# Patient Record
Sex: Female | Born: 1940
Health system: Southern US, Community
[De-identification: ages and names within clinical notes are randomized; demographics above are authoritative.]

## PROBLEM LIST (undated history)

## (undated) DIAGNOSIS — M109 Gout, unspecified: Secondary | ICD-10-CM

## (undated) DIAGNOSIS — H409 Unspecified glaucoma: Secondary | ICD-10-CM

## (undated) DIAGNOSIS — I10 Essential (primary) hypertension: Secondary | ICD-10-CM

## (undated) DIAGNOSIS — E785 Hyperlipidemia, unspecified: Secondary | ICD-10-CM

## (undated) HISTORY — PX: COLONOSCOPY: SHX174

## (undated) HISTORY — PX: ABDOMINAL HYSTERECTOMY: SHX81

## (undated) HISTORY — DX: Gout, unspecified: M10.9

## (undated) HISTORY — PX: BREAST BIOPSY: SHX20

## (undated) HISTORY — DX: Essential (primary) hypertension: I10

## (undated) HISTORY — DX: Unspecified glaucoma: H40.9

## (undated) HISTORY — PX: CATARACT EXTRACTION: SUR2

## (undated) HISTORY — DX: Hyperlipidemia, unspecified: E78.5

---

## 2000-11-27 ENCOUNTER — Inpatient Hospital Stay (HOSPITAL_COMMUNITY): Admission: AD | Admit: 2000-11-27 | Discharge: 2000-11-29 | Payer: Self-pay | Admitting: Family Medicine

## 2000-11-28 ENCOUNTER — Encounter: Payer: Self-pay | Admitting: Family Medicine

## 2000-12-12 ENCOUNTER — Encounter: Admission: RE | Admit: 2000-12-12 | Discharge: 2000-12-12 | Payer: Self-pay | Admitting: Family Medicine

## 2001-06-06 ENCOUNTER — Ambulatory Visit (HOSPITAL_COMMUNITY): Admission: RE | Admit: 2001-06-06 | Discharge: 2001-06-06 | Payer: Self-pay | Admitting: Emergency Medicine

## 2001-06-06 ENCOUNTER — Encounter: Payer: Self-pay | Admitting: Emergency Medicine

## 2003-11-17 ENCOUNTER — Ambulatory Visit: Payer: Self-pay | Admitting: Family Medicine

## 2003-11-18 ENCOUNTER — Ambulatory Visit: Payer: Self-pay | Admitting: *Deleted

## 2003-11-18 ENCOUNTER — Ambulatory Visit: Payer: Self-pay | Admitting: Family Medicine

## 2003-11-19 ENCOUNTER — Encounter (INDEPENDENT_AMBULATORY_CARE_PROVIDER_SITE_OTHER): Payer: Self-pay | Admitting: Family Medicine

## 2003-11-19 LAB — CONVERTED CEMR LAB: TSH: 1.176 microintl units/mL

## 2003-12-18 ENCOUNTER — Ambulatory Visit: Payer: Self-pay | Admitting: Family Medicine

## 2004-02-16 ENCOUNTER — Ambulatory Visit: Payer: Self-pay | Admitting: Family Medicine

## 2004-05-16 ENCOUNTER — Ambulatory Visit: Payer: Self-pay | Admitting: *Deleted

## 2004-05-16 ENCOUNTER — Ambulatory Visit: Payer: Self-pay | Admitting: Internal Medicine

## 2004-06-01 ENCOUNTER — Ambulatory Visit: Payer: Self-pay | Admitting: Family Medicine

## 2004-06-07 ENCOUNTER — Ambulatory Visit (HOSPITAL_COMMUNITY): Admission: RE | Admit: 2004-06-07 | Discharge: 2004-06-07 | Payer: Self-pay | Admitting: Family Medicine

## 2004-09-03 ENCOUNTER — Inpatient Hospital Stay (HOSPITAL_COMMUNITY): Admission: EM | Admit: 2004-09-03 | Discharge: 2004-09-04 | Payer: Self-pay | Admitting: Emergency Medicine

## 2004-09-09 ENCOUNTER — Ambulatory Visit: Payer: Self-pay | Admitting: Family Medicine

## 2004-10-04 ENCOUNTER — Ambulatory Visit: Payer: Self-pay | Admitting: Family Medicine

## 2004-10-05 ENCOUNTER — Encounter (INDEPENDENT_AMBULATORY_CARE_PROVIDER_SITE_OTHER): Payer: Self-pay | Admitting: Family Medicine

## 2005-01-24 ENCOUNTER — Ambulatory Visit: Payer: Self-pay | Admitting: Family Medicine

## 2005-01-24 ENCOUNTER — Ambulatory Visit (HOSPITAL_COMMUNITY): Admission: RE | Admit: 2005-01-24 | Discharge: 2005-01-24 | Payer: Self-pay | Admitting: Family Medicine

## 2005-04-28 ENCOUNTER — Ambulatory Visit: Payer: Self-pay | Admitting: Family Medicine

## 2005-08-25 ENCOUNTER — Ambulatory Visit: Payer: Self-pay | Admitting: Family Medicine

## 2006-07-20 ENCOUNTER — Emergency Department (HOSPITAL_COMMUNITY): Admission: EM | Admit: 2006-07-20 | Discharge: 2006-07-20 | Payer: Self-pay | Admitting: Emergency Medicine

## 2006-10-16 ENCOUNTER — Encounter (INDEPENDENT_AMBULATORY_CARE_PROVIDER_SITE_OTHER): Payer: Self-pay | Admitting: Family Medicine

## 2006-10-16 DIAGNOSIS — M109 Gout, unspecified: Secondary | ICD-10-CM | POA: Insufficient documentation

## 2006-10-16 DIAGNOSIS — E119 Type 2 diabetes mellitus without complications: Secondary | ICD-10-CM | POA: Insufficient documentation

## 2006-10-16 DIAGNOSIS — I10 Essential (primary) hypertension: Secondary | ICD-10-CM | POA: Insufficient documentation

## 2006-10-17 DIAGNOSIS — M1990 Unspecified osteoarthritis, unspecified site: Secondary | ICD-10-CM | POA: Insufficient documentation

## 2006-10-17 DIAGNOSIS — E7800 Pure hypercholesterolemia, unspecified: Secondary | ICD-10-CM | POA: Insufficient documentation

## 2007-01-09 ENCOUNTER — Emergency Department (HOSPITAL_COMMUNITY): Admission: EM | Admit: 2007-01-09 | Discharge: 2007-01-09 | Payer: Self-pay | Admitting: Family Medicine

## 2007-03-15 ENCOUNTER — Ambulatory Visit (HOSPITAL_COMMUNITY): Admission: RE | Admit: 2007-03-15 | Discharge: 2007-03-15 | Payer: Self-pay | Admitting: Family Medicine

## 2008-03-17 ENCOUNTER — Ambulatory Visit (HOSPITAL_COMMUNITY): Admission: RE | Admit: 2008-03-17 | Discharge: 2008-03-17 | Payer: Self-pay | Admitting: Family Medicine

## 2008-03-24 ENCOUNTER — Encounter: Admission: RE | Admit: 2008-03-24 | Discharge: 2008-03-24 | Payer: Self-pay | Admitting: Family Medicine

## 2008-04-14 ENCOUNTER — Emergency Department (HOSPITAL_COMMUNITY): Admission: EM | Admit: 2008-04-14 | Discharge: 2008-04-14 | Payer: Self-pay | Admitting: Emergency Medicine

## 2008-08-26 ENCOUNTER — Encounter: Admission: RE | Admit: 2008-08-26 | Discharge: 2008-08-26 | Payer: Self-pay | Admitting: Family Medicine

## 2008-11-06 ENCOUNTER — Emergency Department (HOSPITAL_COMMUNITY): Admission: EM | Admit: 2008-11-06 | Discharge: 2008-11-06 | Payer: Self-pay | Admitting: Emergency Medicine

## 2008-11-09 ENCOUNTER — Ambulatory Visit: Payer: Self-pay | Admitting: Surgery

## 2009-03-26 ENCOUNTER — Ambulatory Visit (HOSPITAL_COMMUNITY): Admission: RE | Admit: 2009-03-26 | Discharge: 2009-03-26 | Payer: Self-pay | Admitting: Family Medicine

## 2009-11-30 ENCOUNTER — Ambulatory Visit: Payer: Self-pay | Admitting: Cardiology

## 2010-03-20 ENCOUNTER — Encounter: Payer: Self-pay | Admitting: Family Medicine

## 2010-03-28 ENCOUNTER — Ambulatory Visit (HOSPITAL_COMMUNITY): Admission: RE | Admit: 2010-03-28 | Payer: Self-pay | Source: Home / Self Care | Admitting: Internal Medicine

## 2010-04-02 ENCOUNTER — Encounter: Payer: Self-pay | Admitting: Internal Medicine

## 2010-04-18 ENCOUNTER — Other Ambulatory Visit (HOSPITAL_COMMUNITY): Payer: Self-pay | Admitting: Internal Medicine

## 2010-04-18 DIAGNOSIS — Z1231 Encounter for screening mammogram for malignant neoplasm of breast: Secondary | ICD-10-CM

## 2010-05-03 ENCOUNTER — Ambulatory Visit (HOSPITAL_COMMUNITY)
Admission: RE | Admit: 2010-05-03 | Discharge: 2010-05-03 | Disposition: A | Discharge status: Home / Self Care | Payer: Medicare Other | Source: Ambulatory Visit | Attending: Internal Medicine | Admitting: Internal Medicine

## 2010-05-03 DIAGNOSIS — Z1231 Encounter for screening mammogram for malignant neoplasm of breast: Secondary | ICD-10-CM | POA: Insufficient documentation

## 2010-06-03 LAB — URINALYSIS, ROUTINE W REFLEX MICROSCOPIC
Bilirubin Urine: NEGATIVE
Nitrite: NEGATIVE
Specific Gravity, Urine: 1.011 (ref 1.005–1.030)
Urobilinogen, UA: 0.2 mg/dL (ref 0.0–1.0)
pH: 7 (ref 5.0–8.0)

## 2010-06-03 LAB — BASIC METABOLIC PANEL
BUN: 16 mg/dL (ref 6–23)
GFR calc Af Amer: >60 mL/min (ref >60)
GFR calc non Af Amer: 54 mL/min — ABNORMAL LOW (ref >60)
Potassium: 3.6 mEq/L (ref 3.5–5.1)
Sodium: 137 mEq/L (ref 135–145)

## 2010-06-03 LAB — URINE MICROSCOPIC-ADD ON

## 2010-06-03 LAB — GLUCOSE, CAPILLARY: Glucose-Capillary: 142 mg/dL — ABNORMAL HIGH (ref 70–99)

## 2010-07-12 NOTE — Procedures (Signed)
RENAL ARTERY DUPLEX EVALUATION   INDICATION:  Uncontrolled hypertension.   HISTORY:  Diabetes:  Yes.  Cardiac:  No.  Hypertension:  Yes.  Smoking:  No.   RENAL ARTERY DUPLEX FINDINGS:  Aorta-Proximal:  87 cm/s  Aorta-Mid:  84 cm/s  Aorta-Distal:  82 cm/s  Celiac Artery Origin:  143 cm/s  SMA Origin:  158 cm/s                                    RIGHT               LEFT  Renal Artery Origin:             101 cm/s            81 cm/s  Renal Artery Proximal:           130 cm/s            130 cm/s  Renal Artery Mid:                113 cm/s            110 cm/s  Renal Artery Distal:             88 cm/s             73 cm/s  Hilar Acceleration Time (AT):  Renal-Aortic Ratio (RAR):        1.49                1.49  Kidney Size:                     11.1 cm             10.7 cm  End Diastolic Ratio (EDR):  Resistive Index (RI):            0.6                 0.61   IMPRESSION:  1. Bilateral kidneys appear normal with respect to shape and size.  2. No evidence of renal artery stenosis bilaterally.   ___________________________________________  Janetta Hora Fields, MD   AS/MEDQ  D:  11/09/2008  T:  11/10/2008  Job:  [id number]

## 2010-07-15 NOTE — H&P (Signed)
[facility]  Patient:    Crystal Fowler, Crystal Fowler Visit Number: [id number] MRN: [id number]          Service Type: MED Location: [phone] Attending Physician:  Sanjuana Letters Dictated by:   Solon Palm, M.D. Admit Date:  11/27/2000   CC:         Dr. Andee Poles, [facility]  Dr. Kathrene Bongo, [facility]   History and Physical  PRIMARY CARE PHYSICIAN: Dr. Andee Poles, [facility].  RENAL PHYSICIAN: Dr. Kathrene Bongo.  CHIEF COMPLAINT: Acute renal failure.  HISTORY OF PRESENT ILLNESS: The patient is a 70 year old black female, with a history of hypertension and chronic renal insufficiency, who presented to [facility] on November 26, 2000 after having a difficult weekend. She was started on the Clonidine patch, an ARB, and Lasix for improved blood pressure control with her difficult to manage diabetes.  She had complaints over the weekend of nausea, vomiting, malaise, bilateral leg cramping, and had two presyncopal episodes on Sunday.  Symptoms improved since holding the Avapro and the patch and restarting her Clonidine she was taking t.i.d. before.  Leg cramps also resolved.  The patient was called by the primary physician at [facility] after receiving laboratories drawn on November 26, 2000 and was directed for admission for further evaluation upon discussion with Dr. Hyman Hopes, renal associate.  Upon arrival the patient still complained of malaise and fatigue; however, otherwise was asymptomatic.  PAST MEDICAL HISTORY:  1. Hypertension since 1982.  2. Partial hysterectomy in 1981 secondary to fibroid tumors.  3. History of glucose intolerance.  Last hemoglobin A1C was 6.9.  Not     currently on a diabetic diet.  4. Obesity.  MEDICATIONS:  1. Lasix 40 mg q.d.  2. Clonidine 0.3 mg p.o. t.i.d.  3. Toprol XL 20 mg p.o. q.d.  4. Avapro.  ALLERGIES: No known drug allergies.  FAMILY HISTORY: Mother  died at 19 years old of renal carcinoma.  Father died at 41 of MI and MVA.  She has four children who are healthy.  SOCIAL HISTORY: She is divorced and lives alone.  No tobacco, alcohol, or drugs.  REVIEW OF SYSTEMS: No recent weight change.  No fever or chills, sore throat, productive cough, chest pain, abdominal pain, constipation, diarrhea today though she did have some emesis and diarrhea over the weekend with leg cramps.  PHYSICAL EXAMINATION:  VITAL SIGNS: Blood pressure 146/84, heart rate 80, respirations 20, temperature 98.2 degrees.  Weight 81.71 kg.  GENERAL: The patient is comfortable, pleasant, in no acute distress.  HEENT: Normocephalic.  PERRLA.  EOMI.  Sclerae clear.  Nares patent. Oropharynx, mucosa moist.  CHEST: Clear to auscultation bilaterally.  No wheezes, rales or rhonchi.  Good effort.  CARDIOVASCULAR: Regular rate and rhythm without murmurs.  PMI nondisplaced. Pulses +2 to lower extremities.  ABDOMEN: Soft.  No hepatosplenomegaly.  Positive bowel sounds.  Nontender, nondistended.  EXTREMITIES: Without edema.  LABORATORY DATA: Sodium 138, potassium 4.0, chloride 103, CO2 27, BUN 46, creatinine 2.2.  This is an improvement from yesterdays creatinine at [facility], which was 4.4.  Albumin 2.9, calcium 9.6, phosphorus 4.6.  ASSESSMENT/PLAN: This is a 70 year old black female with hypertension and chronic renal insufficiency.  Acute renal failure.  Creatinine actually improved on primary laboratories from yesterday.  Renal service is aware of patients admission.  Was discussed with Dr. Andee Poles.  Per recommendation of Dr. Hyman Hopes the patient will have a renal ultrasound and  MRA in the a.m.  Believe patients condition likely prerenal volume depletion.  Will gently hydrate with intravenous fluid.  Hold diuretic and angiotensin II receptor blocker. Follow blood pressure.  Will consult renal service. Dictated by:   Solon Palm, M.D. Attending Physician:   Sanjuana Letters DD:  11/28/00 TD:  11/28/00 Job: [id number]

## 2010-07-15 NOTE — Discharge Summary (Signed)
[facility]  Patient:    Crystal Fowler, Crystal Fowler Visit Number: [id number] MRN: [id number]          Service Type: MED Location: [phone] Attending Physician:  Sanjuana Letters Dictated by:   Vear Clock, M.D. Admit Date:  11/27/2000 Discharge Date: 11/29/2000   CC:         Dr. _______ [facility], primary physician   Discharge Summary  CHIEF COMPLAINT:  Acute renal failure.  PRIMARY PHYSICIAN:  Dr. __________, [facility].  DISCHARGE DIAGNOSES: 1. Hypertension. 2. Acute renal failure likely secondary to prerenal causes. 3. Hyperglycemia.  DISCHARGE MEDICATIONS; 1. Toprol XL 100 mg p.o. q.d. 2. Hydrochlorothiazide 25 mg p.o. q.d. 3. Clonidine 0.3 mg p.o. t.i.d.  FOLLOW-UP:  The patient is to follow up at [facility] next Wednesday, December 05, 2000, at 8 a.m. with Dr. ________.  Dr. ________ is to evaluate the patients renal function, hyperglycemia and hypertension at that time and is to ascertain whether the patient needs to see Garnetta Buddy, M.D., for follow-up at that time.  PROCEDURES AND DIAGNOSTIC STUDIES:  Renal MRA which was essentially negative but with one filling defect which could have been an artifact.  Renal ultrasound within normal limits.  CONSULTANT:  Cecille Aver, M.D., who the patient declined to see.  ADMISSION H&P:  This is a 70 year old African-American female with a history of hypertension and chronic renal insufficiency who [facility], [facility], after having a difficult weekend. She started on the clonidine patch and ARB and Lasix for blood pressure control with her difficult to manage hypertension.  She was started on those medicines by Dr. Kathrene Bongo.  She had complaints over the weekend of nausea and vomiting and malaise, bilateral leg cramping and two episodes of presyncope on Sunday.  The patient attempted to contact Dr.  Kathrene Bongo and did not receive a response.  Symptoms improved since holding the Avapro and the patch and restarting her clonidine she was taking t.i.d. prior to this episode.  Leg cramps also resolved.  The patient was called [facility] after receiving laboratories drawn on November 26, 2000, and was directed for admission for further evaluation upon discussion with Dr. Hyman Hopes, renal associate.  LABS ON ADMISSION:  Sodium 138, potassium 4.0, chloride 103, CO2 27, BUN 46, creatinine 2.2 which is an improvement from the day prior to admissions creatinine at [facility] which was 4.4.  HOSPITAL COURSE:  #1 - RENAL:  The patient was worked up for essential hypertension versus RTA with CBC, BMET, TSH, chest x-ray, and EKG.  All of these tests were essentially normal except for hyperglycemia, BUN and creatinine.  Elevation of BUN and creatinine resolved during the patients hospitalization.  Renal ultrasound and MRA were essentially normal.  Per radiology, the filling defect on the MRA is likely an artifact based on patient respiration at the end of the study causing elevation of the kidney; however, if the patient continues with refractory hypertension that does not respond to medications, the patient may need eventual renal balloon angioplasty secondary to fibromuscular dysplasia.  As the patients kidney function tests had normalized prior to discharge and the patient was feeling better, the patient was discharged to home with follow-up in one week.  #2 - HYPOKALEMIA:  On November 28, 2000, potassium was found to be 3.1.  It was likely secondary to nausea and vomiting on admission which  resolved during the patients hospital course.  Repeat BMET on November 29, 2000, showed resolution of the hyperkalemia but the patient was still given two doses of K-Dur prior to discharge.  #3 -  HYPERTENSION:  The patient was stable on Toprol XL and  clonidine. Hydrochlorothiazide was added to the patients regimen for outpatient.  Dr. ________ to evaluate the patient as an outpatient to determine whether a taper of the clonidine would be appropriate for this patient.  Again, if patient continues to have refractory hypertension, a further renal work-up may be indicated.  #4 - HYPERGLYCEMIA:  The patients blood sugars remained elevated throughout her hospitalization.  This should be followed up on an outpatient basis with Dr. _______.  DISCHARGE LABS:  CBC on November 28, 2000, wbc 6.3, hemoglobin 13.2, hematocrit 39.1, platelets 261 with an MCV of 85.5.  BMET on November 29, 2000, shows sodium 140, potassium 3.5, chloride 105, CO2 30, BUN 18, creatinine 1.2, and glucose 137.  Other glucose values during admission were 166 and 111 on admission.  UA during admission was negative except for glycosuria at 250. A 24-hour urine is pending for creatinine, protein, electrolytes, as well as VMA and metanephrines.  Urine culture was negative. TSH 1.615.  Chest x-ray shows mild cardiac enlargement with no active disease.  The patient was stabilized and discharged to home without further incident.  Of note, additionally, the patient did have an apparent panic attack after her MRA for approximately two hours after the procedure, the patient was lightheaded with palpitations and shortness of breath and overall discomfort. This resolved with time and the patient was stable prior to discharge.Dictated y:   Vear Clock, M.D. Attending Physician:  Sanjuana Letters DD:  11/29/00 TD:  11/30/00 Job: [id number]

## 2010-07-15 NOTE — H&P (Signed)
Crystal Fowler, Crystal Fowler                  ACCOUNT NO.:  [id number]   MEDICAL RECORD NO.:  [id number]          PATIENT TYPE:  INP   LOCATION:  1823                         FACILITY:  [facility]   PHYSICIAN:  Renato Battles, M.D.     DATE OF BIRTH:  [birth year]   DATE OF ADMISSION:  09/03/2004  DATE OF DISCHARGE:                                HISTORY & PHYSICAL   REASON FOR ADMISSION:  Hypertensive emergency, headache, and chest pain.   PRIMARY CARE PHYSICIAN:  Dr. Emelda Fear from [facility].   HISTORY OF PRESENT ILLNESS:  The patient is a pleasant 70 year old African-  American female who has experienced severe continuous throbbing occipital  headache for the last 24 hours with one episode of chest pain earlier today,  left sided, radiating to the left shoulder, so she presented to the  emergency room where initial check of blood pressure revealed blood pressure  of 229/148. The patient received some clonidine and labetalol, and this  brought the blood pressure down to better numbers, and her chest pain  resolved; however, she continued to have shoulder pain and headache. At this  time, the patient denies any shortness of breath. Denies any chest pain. She  reports generalized weakness but no focal deficits.   REVIEW OF SYSTEMS:  CONSTITUTIONAL:  No fever, chills, or night sweats. No  weight changes. GASTROINTESTINAL:  Positive for nausea and vomiting x2  earlier today. No diarrhea or constipation. CARDIOPULMONARY:  Positive for  chest pain as described above. No shortness of breath. No orthopnea or PND.  No cough. GENITOURINARY:  No dysuria, hematuria, or retention.   PAST MEDICAL HISTORY:  1.  Hypertension.  2.  Type 2 diabetes.  3.  Hyperlipidemia.  4.  Partial hysterectomy secondary to fibroids.   FAMILY HISTORY:  Positive for hypertension and CAD.   SOCIAL HISTORY:  She lives alone in [place]. She is a part time school  custodian. No tobacco or alcohol or drugs.   ALLERGIES:  No  known drug allergies.   HOME MEDICATIONS:  1.  Monopril 20 mg p.o. q.d.  2.  Hydrochlorothiazide 25 mg p.o. q.d.  3.  Zocor 10 mg p.o. q.d.  4.  Amaryl 4 mg p.o. b.i.d.  5.  Clonidine 0.3 mg p.o. t.i.d.   PHYSICAL EXAMINATION:  GENERAL:  The patient is alert and oriented x3 in  mild distress.  VITAL SIGNS:  Temperature 97.4, heart rate 75, respiratory rate 22, blood  pressure currently 150/112.  HEENT:  The head is atraumatic and normocephalic. Pupils are equal, round,  and reactive to light and accommodation. Extraocular movements intact  bilaterally.  NECK:  No lymphadenopathy. No thyromegaly. No JVD.  CHEST:  Clear to auscultation bilaterally. No wheezing, rales, or rhonchi.  HEART:  Regular rate and rhythm. No murmurs.  ABDOMEN:  Soft, nontender, nondistended. No active bowel sounds.  EXTREMITIES:  No cyanosis, edema, or clubbing.   STUDIES:  CBC showed elevated hemoglobin of 15.6, normal white count and  platelets.   Electrolytes showed potassium of 3.1, otherwise normal. Normal renal  function. Glucose 151.  Liver functions showed elevated AST at 81 with normal ALT, normal alkaline  phosphatase, normal protein and albumin.   Head CT showed no acute changes, no bleed. Some subacute lacunar infarcts in  the basal ganglia.   Chest x-ray was negative.   ASSESSMENT:  1.  Hypertensive emergency.  2.  Chest pain.  3.  Type 2 diabetes.  4.  Elevated AST.  5.  Hypokalemia.  6.  Dehydration.  7.  Hyperlipidemia.   PLAN OF ACTION:  1.  Admit to intensive care unit.  2.  Start home medicines.  3.  Nitroglycerin drip to keep blood pressure below 150/95.  4.  K-Dur to replace potassium.  5.  IV fluids for dehydration.  6.  Recheck liver functions.  7.  Cardiac enzymes q.8h. x3 and EKG.  8.  DVT prophylaxis.       SA/MEDQ  D:  09/03/2004  T:  09/03/2004  Job:  [id number]   cc:   Emelda Fear, M.D.  [facility]

## 2010-07-15 NOTE — Discharge Summary (Signed)
Crystal Fowler, Crystal Fowler                  ACCOUNT NO.:  [id number]   MEDICAL RECORD NO.:  [id number]          PATIENT TYPE:  INP   LOCATION:  3705                         FACILITY:  [facility]   PHYSICIAN:  Elliot Cousin, M.D.    DATE OF BIRTH:  [birth year]   DATE OF ADMISSION:  09/03/2004  DATE OF DISCHARGE:  09/04/2004                                 DISCHARGE SUMMARY   DISCHARGE DIAGNOSES:  1.  Hypertensive emergency.  2.  Chest pain, myocardial infarction ruled out with negative cardiac      enzymes.  3.  Acute renal insufficiency, resolved during the hospital course.  4.  Chronic versus subacute small left basil ganglia stroke per CT scan of      the head without contrast.  5.  Type 2 diabetes mellitus.  6.  Hyperlipidemia  7.  Headache thought to be secondary to severe hypertension.  8.  Transient hypokalemia, repleted during the hospital course.Marland Kitchen   DISCHARGE MEDICATIONS:  1.  Clonidine 0.2 mg 1/2 tablet at breakfast, 1 pill at lunch, and 1 pill at      bedtime. Continue this regimen for 2 days and then called Dr. Audria Nine      for further management.  2.  Monopril 20 mg 1/2 tablet at breakfast for 2 days, then back up to 1      pill at breakfast daily  3.  Hydrochlorothiazide 25 mg 1/2 tablet daily for 2 days and then back up      to 1 pill daily thereafter.  4.  Amaryl 4 mg 1/2 tablet twice daily. (Do not take if blood sugar is less      than 100.)  5.  Aspirin 325 mg daily.  6.  Zocor 10 mg at bedtime.  7.  Tylenol 650 mg every 6 hours as needed for pain.   PROCEDURE PERFORMED:  CT scan of the head without contrast on July8,2006.  The results revealed lacunar infarct left basal ganglia, age indeterminate,  most likely subacute or chronic. No evidence of hemorrhage, hydrocephalus,  tumor, vascular lesion, or extra-axial fluid collection.   HISTORY OF PRESENT ILLNESS:  The patient is a 70 year old African-American  lady with a past medical history significant for hypertension,  type 2  diabetes mellitus, and hyperlipidemia, who presented to the emergency  department on July8,2006, with a chief complaint of left-sided chest pain,  generalized weakness, and occipital headache. When the patient was evaluated  in the emergency department, her blood pressure was 229/148. A CT scan of  the head revealed a small lacunar infarct in the left basil ganglia, most  likely subacute or chronic. The EKG revealed normal sinus rhythm with  biatrial enlargement. The patient was, therefore, admitted for further  evaluation and management.   HOSPITAL COURSE:  1.  HYPERTENSIVE EMERGENCY: The initial treatment started in the emergency      department, when the patient was given clonidine and IV labetalol x 1.      The patient was then started on a nitroglycerin drip. The intent was to      transfer  the patient to the ICU, however, the patient had to reside in      the emergency department most of the day. While in the emergency      department, the patient's blood pressure fell to 116 systolically. The      nitroglycerin drip was, therefore, discontinued. Prior to the      discontinuation of the nitroglycerin drip, the patient was restarted on      all of her home medications which included clonidine 3 mg in the      morning, Monopril 20 mg in the morning, and HCTZ 25 mg in the morning.      The patient did complain of some dizziness and lightheadedness with a      fall in the blood pressure. She was subsequently transferred to a      telemetry bed. Her antihypertensive medications were modified. The      Monopril and HCTZ were held for 24 hours following the initial dose that      was given on the morning of hospital admission. The clonidine was      decreased to only 0.1 mg the following day. . Over the past 24 hours,      the patient's blood pressure has ranged from 106-140 systolically and 68-      89 diastolically. The patient still complains of a mild headache;      however,  it is much improved. The patient no longer complains of chest      pain.    Cardiac enzymes were ordered to rule out a myocardial infarction. The  cardiac enzymes x3 were completely negative. The TSH was assessed and was  within normal limits at 2.207. The CT scan of the head, as stated above,  revealed a small left lacunar basil ganglia infarct which appeared to be  subacute or chronic.  It is doubtful that the patient's headache was  secondary to the infarct. The patient was informed of the CT scan findings.  She was certainly advised to be compliant with antihypertensive medication  regimens as prescribed by Dr. Audria Nine. The patient was also advised to  follow a low-salt diabetic diet. The patient's blood pressures during  hospital course have been reasonable and somewhat well controlled, although  it has been just a little over 24 hours. Given the recent blood pressures,  the patient was given specific instructions on modifying the regimen. She  was advised to take clonidine 0.2 mg 1/2 tablet at breakfast, 1 tablet at  lunch, and 1 tablet at bedtime for 2 days and then call Dr. Audria Nine for  further followup.  As indicated above, the patient was prescribed Monopril  and HCTZ at half the doses x2 days and then back up to the usual dose as  previously prescribed. The patient stated that she understood. The patient  demonstrated no focal neurological findings on exam during hospitalization.  She ambulated with her daughter without difficulty prior to hospital  discharge. The patient was advised to take Tylenol as needed for headache.   #2.  ACUTE RENAL INSUFFICIENCY: During the hospital course, the patient's  BUN increased from 14 to 31, and the creatinine increased from 1.3 to 1.9.  She was started on gentle IV fluids for 24 hours. Following gentle volume  repletion therapy, her BUN improved to 23, and her creatinine improved to 1.3. The acute renal insufficiency may have been  secondary to volume  depletion versus hypoperfusion of the kidneys after her blood pressures fell  dramatically during hospital course.   #3.  TYPE 2 DIABETES MELLITUS:  The the patient was restarted on Amaryl 4 mg  twice daily. A sliding scale insulin regimen was prescribed as well. Her  capillary blood sugars were assessed q.a.c. and q.h.s.. The patient did have  an episode of a low blood sugar ranging into the 40s. The Amaryl was,  therefore, discontinued, and the patient was just treated with a sliding  scale insulin regimen. At the time of hospital discharge, her capillary  blood sugar was 91. The patient was, therefore, advised to take 4 mg 1/2  tablet b.i.d. until her capillary blood sugars became greater than 160. When  her capillary blood sugars reach 160 or greater, she was advised to return  to 4 mg of Amaryl b.i.d. The patient was also advised to not take Amaryl for  capillary blood sugars below 100. Her hemoglobin A1c was assessed during the  hospital course and was found to be 7.9. There may be an element of  noncompliance.   DISCHARGE LABORATORY DATA:  Sodium 137. Potassium 4.0, chloride 102, CO2 30,  glucose 122, BUN 23, creatinine 1.3, calcium 9.0   DISCHARGE DISPOSITION:  The patient will be discharged home on today. She is  improved and in stable condition. She was advised to follow up with Dr.  Audria Nine in 3-4 days. The patient was advised to check her blood pressures  daily over the next few days. She was advised to call Dr. Audria Nine if her  systolic blood pressures  were greater than 165. The patient was advised to take medications as  prescribed. She was also advised to follow a low-salt diabetic diet. As  pointed out above, there may be an element of noncompliance given that the  patient's capillary blood sugars and blood pressures were well-controlled  during hospital course.       DF/MEDQ  D:  09/04/2004  T:  09/04/2004  Job:  [id number]

## 2010-09-14 ENCOUNTER — Ambulatory Visit: Payer: Medicare Other | Admitting: Cardiology

## 2011-04-04 ENCOUNTER — Other Ambulatory Visit (HOSPITAL_COMMUNITY)
Admission: RE | Admit: 2011-04-04 | Disposition: A | Discharge status: Home / Self Care | Payer: Medicare Other | Source: Ambulatory Visit | Attending: Internal Medicine | Admitting: Internal Medicine

## 2011-04-04 ENCOUNTER — Other Ambulatory Visit (HOSPITAL_COMMUNITY)
Admission: RE | Admit: 2011-04-04 | Discharge: 2011-04-04 | Disposition: A | Discharge status: Home / Self Care | Payer: Medicare Other | Attending: Internal Medicine | Admitting: Internal Medicine

## 2011-04-04 ENCOUNTER — Other Ambulatory Visit: Payer: Self-pay | Admitting: Registered Nurse

## 2011-04-04 DIAGNOSIS — Z124 Encounter for screening for malignant neoplasm of cervix: Secondary | ICD-10-CM | POA: Insufficient documentation

## 2011-04-20 ENCOUNTER — Other Ambulatory Visit (HOSPITAL_COMMUNITY): Payer: Self-pay | Admitting: Internal Medicine

## 2011-04-20 DIAGNOSIS — Z1231 Encounter for screening mammogram for malignant neoplasm of breast: Secondary | ICD-10-CM

## 2011-05-15 ENCOUNTER — Ambulatory Visit (HOSPITAL_COMMUNITY)
Admission: RE | Admit: 2011-05-15 | Discharge: 2011-05-15 | Disposition: A | Discharge status: Home / Self Care | Payer: Medicare Other | Source: Ambulatory Visit | Attending: Internal Medicine | Admitting: Internal Medicine

## 2011-05-15 DIAGNOSIS — Z1231 Encounter for screening mammogram for malignant neoplasm of breast: Secondary | ICD-10-CM | POA: Insufficient documentation

## 2011-07-07 ENCOUNTER — Encounter: Payer: Self-pay | Admitting: *Deleted

## 2011-11-09 ENCOUNTER — Encounter: Payer: Self-pay | Admitting: *Deleted

## 2012-04-18 ENCOUNTER — Other Ambulatory Visit (HOSPITAL_COMMUNITY): Payer: Self-pay | Admitting: Internal Medicine

## 2012-04-18 DIAGNOSIS — Z1231 Encounter for screening mammogram for malignant neoplasm of breast: Secondary | ICD-10-CM

## 2012-05-16 ENCOUNTER — Ambulatory Visit (HOSPITAL_COMMUNITY)
Admission: RE | Admit: 2012-05-16 | Discharge: 2012-05-16 | Disposition: A | Discharge status: Home / Self Care | Payer: Medicare Other | Source: Ambulatory Visit | Attending: Internal Medicine | Admitting: Internal Medicine

## 2012-05-16 DIAGNOSIS — Z1231 Encounter for screening mammogram for malignant neoplasm of breast: Secondary | ICD-10-CM | POA: Insufficient documentation

## 2012-05-21 ENCOUNTER — Other Ambulatory Visit: Payer: Self-pay | Admitting: Internal Medicine

## 2012-05-21 DIAGNOSIS — R928 Other abnormal and inconclusive findings on diagnostic imaging of breast: Secondary | ICD-10-CM

## 2012-05-30 ENCOUNTER — Ambulatory Visit
Admission: RE | Admit: 2012-05-30 | Discharge: 2012-05-30 | Disposition: A | Discharge status: Home / Self Care | Payer: Medicare Other | Source: Ambulatory Visit | Attending: Internal Medicine | Admitting: Internal Medicine

## 2012-05-30 ENCOUNTER — Other Ambulatory Visit: Payer: Self-pay | Admitting: Internal Medicine

## 2012-05-30 DIAGNOSIS — R928 Other abnormal and inconclusive findings on diagnostic imaging of breast: Secondary | ICD-10-CM

## 2012-06-04 ENCOUNTER — Ambulatory Visit
Admission: RE | Admit: 2012-06-04 | Discharge: 2012-06-04 | Disposition: A | Discharge status: Home / Self Care | Payer: Medicare Other | Source: Ambulatory Visit | Attending: Internal Medicine | Admitting: Internal Medicine

## 2012-06-04 DIAGNOSIS — R928 Other abnormal and inconclusive findings on diagnostic imaging of breast: Secondary | ICD-10-CM

## 2012-11-17 ENCOUNTER — Emergency Department (HOSPITAL_COMMUNITY)
Admission: EM | Admit: 2012-11-17 | Discharge: 2012-11-18 | Disposition: A | Discharge status: Home / Self Care | Payer: Medicare Other | Attending: Emergency Medicine | Admitting: Emergency Medicine

## 2012-11-17 ENCOUNTER — Emergency Department (HOSPITAL_COMMUNITY): Payer: Medicare Other

## 2012-11-17 ENCOUNTER — Encounter (HOSPITAL_COMMUNITY): Payer: Self-pay | Admitting: Emergency Medicine

## 2012-11-17 DIAGNOSIS — E785 Hyperlipidemia, unspecified: Secondary | ICD-10-CM | POA: Insufficient documentation

## 2012-11-17 DIAGNOSIS — I10 Essential (primary) hypertension: Secondary | ICD-10-CM | POA: Insufficient documentation

## 2012-11-17 DIAGNOSIS — E119 Type 2 diabetes mellitus without complications: Secondary | ICD-10-CM | POA: Insufficient documentation

## 2012-11-17 DIAGNOSIS — N390 Urinary tract infection, site not specified: Secondary | ICD-10-CM | POA: Insufficient documentation

## 2012-11-17 DIAGNOSIS — N200 Calculus of kidney: Secondary | ICD-10-CM | POA: Insufficient documentation

## 2012-11-17 DIAGNOSIS — Z79899 Other long term (current) drug therapy: Secondary | ICD-10-CM | POA: Insufficient documentation

## 2012-11-17 LAB — CBC WITH DIFFERENTIAL/PLATELET
Basophils Absolute: 0 10*3/uL (ref 0.0–0.1)
Basophils Relative: 0 % (ref 0–1)
Eosinophils Absolute: 0.1 10*3/uL (ref 0.0–0.7)
Eosinophils Relative: 1 % (ref 0–5)
HCT: 37.8 % (ref 36.0–46.0)
MCH: 29.6 pg (ref 26.0–34.0)
MCHC: 33.9 g/dL (ref 30.0–36.0)
MCV: 87.3 fL (ref 78.0–100.0)
Monocytes Absolute: 0.5 10*3/uL (ref 0.1–1.0)
Platelets: 249 10*3/uL (ref 150–400)
RDW: 14.5 % (ref 11.5–15.5)
WBC: 6.6 10*3/uL (ref 4.0–10.5)

## 2012-11-17 LAB — URINALYSIS, ROUTINE W REFLEX MICROSCOPIC
Bilirubin Urine: NEGATIVE
Ketones, ur: NEGATIVE mg/dL
Nitrite: NEGATIVE
Protein, ur: NEGATIVE mg/dL

## 2012-11-17 LAB — BASIC METABOLIC PANEL
BUN: 17 mg/dL (ref 6–23)
Calcium: 10.3 mg/dL (ref 8.4–10.5)
Creatinine, Ser: 1.06 mg/dL (ref 0.50–1.10)
GFR calc Af Amer: 60 mL/min — ABNORMAL LOW (ref >90)

## 2012-11-17 LAB — URINE MICROSCOPIC-ADD ON

## 2012-11-17 MED ORDER — SODIUM CHLORIDE 0.9 % IV BOLUS (SEPSIS)
1000.0000 mL | Freq: Once | INTRAVENOUS | Status: AC
  Administered 2012-11-17: 1000 mL via INTRAVENOUS

## 2012-11-17 MED ORDER — CIPROFLOXACIN HCL 500 MG PO TABS: 500.0000 mg | ORAL_TABLET | Freq: Two times a day (BID) | ORAL | Status: DC

## 2012-11-17 MED ORDER — CIPROFLOXACIN HCL 500 MG PO TABS
500.0000 mg | ORAL_TABLET | Freq: Once | ORAL | Status: AC
  Administered 2012-11-17: 500 mg via ORAL
  Filled 2012-11-17: qty 1

## 2012-11-17 MED ORDER — ONDANSETRON HCL 4 MG/2ML IJ SOLN
4.0000 mg | Freq: Once | INTRAMUSCULAR | Status: AC
  Administered 2012-11-17: 4 mg via INTRAVENOUS
  Filled 2012-11-17: qty 2

## 2012-11-17 MED ORDER — IOHEXOL 300 MG/ML  SOLN
100.0000 mL | Freq: Once | INTRAMUSCULAR | Status: AC | PRN
  Administered 2012-11-17: 100 mL via INTRAVENOUS

## 2012-11-17 MED ORDER — FENTANYL CITRATE 0.05 MG/ML IJ SOLN
50.0000 ug | Freq: Once | INTRAMUSCULAR | Status: AC
  Administered 2012-11-17: 50 ug via INTRAVENOUS
  Filled 2012-11-17: qty 2

## 2012-11-17 MED ORDER — HYDROCODONE-ACETAMINOPHEN 5-325 MG PO TABS: 1.0000 | ORAL_TABLET | Freq: Four times a day (QID) | ORAL | Status: DC | PRN

## 2012-11-17 MED ORDER — IOHEXOL 300 MG/ML  SOLN
50.0000 mL | Freq: Once | INTRAMUSCULAR | Status: AC | PRN
  Administered 2012-11-17: 50 mL via ORAL

## 2012-11-17 MED ORDER — MORPHINE SULFATE 4 MG/ML IJ SOLN
4.0000 mg | Freq: Once | INTRAMUSCULAR | Status: AC
  Administered 2012-11-17: 4 mg via INTRAVENOUS
  Filled 2012-11-17: qty 1

## 2012-11-17 NOTE — ED Notes (Signed)
Patient transported to CT 

## 2012-11-17 NOTE — ED Notes (Signed)
Patient ambulated to bathroom.

## 2012-11-17 NOTE — ED Provider Notes (Signed)
CSN: [id number]     Arrival date & time 11/17/12  2032 History   First MD Initiated Contact with Patient 11/17/12 2048     Chief Complaint  Patient presents with  . Abdominal Pain   (Consider location/radiation/quality/duration/timing/severity/associated sxs/prior Treatment) Patient is a 72 y.o. female presenting with abdominal pain.  Abdominal Pain  Pt reports 2 days of gradually worsening L sided hip/flank pain. Worse with some movement, radiating into L leg. Not associated with fever, vomiting, diarrhea, constipation or dysuria. No prior history of same.   Past Medical History  Diagnosis Date  . Hypertension     Refractory / Severe  . Chest pain   . Dyspnea   . Diabetes mellitus     type 2  . Dyslipidemia    History reviewed. No pertinent past surgical history. Family History  Problem Relation Age of Onset  . Heart attack Father 28  . Cancer Mother 38    Urinary Tract   History  Substance Use Topics  . Smoking status: Never Smoker   . Smokeless tobacco: Not on file  . Alcohol Use: No   OB History   Grav Para Term Preterm Abortions TAB SAB Ect Mult Living                 Review of Systems  Gastrointestinal: Positive for abdominal pain.   All other systems reviewed and are negative except as noted in HPI.   Allergies  Review of patient's allergies indicates no known allergies.  Home Medications   Current Outpatient Rx  Name  Route  Sig  Dispense  Refill  . amLODipine-olmesartan (AZOR) 10-40 MG per tablet   Oral   Take 1 tablet by mouth daily.         . carvedilol (COREG) 25 MG tablet   Oral   Take 25 mg by mouth 2 (two) times daily with a meal.         . cloNIDine (CATAPRES) 0.3 MG tablet   Oral   Take 0.3 mg by mouth 3 (three) times daily.         . metFORMIN (GLUCOPHAGE) 1000 MG tablet   Oral   Take 1,000 mg by mouth 2 (two) times daily with a meal.         . Multiple Vitamin (MULTIVITAMIN) tablet   Oral   Take 1 tablet by mouth  daily.         . pioglitazone (ACTOS) 45 MG tablet   Oral   Take 45 mg by mouth daily.         . rosuvastatin (CRESTOR) 10 MG tablet   Oral   Take 10 mg by mouth once a week.           BP 165/86  Pulse 70  Temp(Src) 98.7 F (37.1 C) (Oral)  Resp 18  SpO2 97% Physical Exam  Nursing note and vitals reviewed. Constitutional: She is oriented to person, place, and time. She appears well-developed and well-nourished.  HENT:  Head: Normocephalic and atraumatic.  Eyes: EOM are normal. Pupils are equal, round, and reactive to light.  Neck: Normal range of motion. Neck supple.  Cardiovascular: Normal rate, normal heart sounds and intact distal pulses.   Pulmonary/Chest: Effort normal and breath sounds normal.  Abdominal: Bowel sounds are normal. She exhibits no distension. There is tenderness (LLQ). There is no rebound and no guarding.  Musculoskeletal: Normal range of motion. She exhibits tenderness (L lateral hip). She exhibits no edema.  Neurological: She  is alert and oriented to person, place, and time. She has normal strength. No cranial nerve deficit or sensory deficit.  Skin: Skin is warm and dry. No rash noted.  Psychiatric: She has a normal mood and affect.    ED Course  Procedures (including critical care time) Labs Review Labs Reviewed  URINALYSIS, ROUTINE W REFLEX MICROSCOPIC - Abnormal; Notable for the following:    APPearance CLOUDY (*)    Hgb urine dipstick SMALL (*)    Leukocytes, UA LARGE (*)    All other components within normal limits  BASIC METABOLIC PANEL - Abnormal; Notable for the following:    Glucose, Bld 134 (*)    GFR calc non Af Amer 52 (*)    GFR calc Af Amer 60 (*)    All other components within normal limits  CBC WITH DIFFERENTIAL  URINE MICROSCOPIC-ADD ON   Imaging Review Ct Abdomen Pelvis W Contrast  11/17/2012   CLINICAL DATA:  Left lower quadrant abdominal pain with abdominal distention, nausea and vomiting.  EXAM: CT ABDOMEN AND  PELVIS WITH CONTRAST  TECHNIQUE: Multidetector CT imaging of the abdomen and pelvis was performed using the standard protocol following bolus administration of intravenous contrast.  CONTRAST:  50mL OMNIPAQUE IOHEXOL 300 MG/ML SOLN, OMNIPAQUE IOHEXOL 300 MG/ML SOLN ; The patient was given the usual instructions regarding cessation of metformin therapy related to intravenous contrast administration and is to follow up with the ordering physician.  COMPARISON:  Abdominal pelvic CT 08/26/2008.  FINDINGS: There is mild scarring at the lung bases. No significant pleural or pericardial effusion is present.  There are nonobstructing calculi in the lower pole and interpolar region of the left kidney. The left kidney demonstrates mild hydronephrosis, perinephric soft tissue stranding and delayed contrast excretion. There is an obstructing 2 mm calculus at the left ureterovesical junction (axial image number 71). The right kidney and right ureter appear normal. No bladder abnormalities are seen.  The liver, gallbladder, biliary system, pancreas, spleen and adrenal glands appear normal.  No other inflammatory changes are identified. There is minimal atherosclerosis for age. There is no pelvic mass. Both ovaries appear normal.  There are mild degenerative changes in the spine. The common hamstring tendons are degenerated with calcifications bilaterally.  IMPRESSION: 1. Obstructing 2 mm calculus at the left ureterovesical junction as described. 2. No other acute findings. No inflammatory changes demonstrated. 3. Nonobstructing left renal calculi.   Electronically Signed   By: Roxy Horseman   On: 11/17/2012 23:34    MDM   1. Kidney stone   2. UTI (urinary tract infection)     Pt points mainly to L hip and lower back, but has tenderness over the LLQ as well. ?diverticulitis or kidney stone vs arthritis/sciatica. Will check labs, UA, and CT. Pain and nausea meds.   11:44 PM Labs and imaging reviewed. CT shows renal  stone. ?UTI on UA. Pain improved, resting comfortably. Will treat with Cipro, pain medications and Urology followup if pain persists or stone does not pass soon.    Charles B. Bernette Mayers, MD 11/17/12 [phone]

## 2012-11-17 NOTE — ED Notes (Signed)
Pt presents to ED with a complaint of left lower flank pain.  Pt has been experiencing pain since Saturday Night.

## 2012-11-17 NOTE — ED Notes (Signed)
Sheldon, MD at bedside.  

## 2012-11-19 LAB — URINE CULTURE: Colony Count: 4000

## 2012-12-28 HISTORY — PX: COLONOSCOPY: SHX174

## 2013-06-10 ENCOUNTER — Other Ambulatory Visit (HOSPITAL_COMMUNITY): Payer: Self-pay | Admitting: Internal Medicine

## 2013-06-10 DIAGNOSIS — Z1231 Encounter for screening mammogram for malignant neoplasm of breast: Secondary | ICD-10-CM

## 2013-06-17 ENCOUNTER — Ambulatory Visit (HOSPITAL_COMMUNITY)
Admission: RE | Admit: 2013-06-17 | Discharge: 2013-06-17 | Disposition: A | Discharge status: Home / Self Care | Payer: Medicare Other | Source: Ambulatory Visit | Attending: Internal Medicine | Admitting: Internal Medicine

## 2013-06-17 DIAGNOSIS — Z1231 Encounter for screening mammogram for malignant neoplasm of breast: Secondary | ICD-10-CM

## 2013-07-22 ENCOUNTER — Emergency Department (INDEPENDENT_AMBULATORY_CARE_PROVIDER_SITE_OTHER)
Admission: EM | Admit: 2013-07-22 | Discharge: 2013-07-22 | Disposition: A | Discharge status: Home / Self Care | Payer: Medicare Other | Source: Home / Self Care | Attending: Family Medicine | Admitting: Family Medicine

## 2013-07-22 DIAGNOSIS — N390 Urinary tract infection, site not specified: Secondary | ICD-10-CM

## 2013-07-22 LAB — POCT URINALYSIS DIP (DEVICE)
Bilirubin Urine: NEGATIVE
GLUCOSE, UA: NEGATIVE mg/dL
Ketones, ur: NEGATIVE mg/dL
Nitrite: NEGATIVE
Protein, ur: NEGATIVE mg/dL
SPECIFIC GRAVITY, URINE: 1.02 (ref 1.005–1.030)
UROBILINOGEN UA: 0.2 mg/dL (ref 0.0–1.0)
pH: 6 (ref 5.0–8.0)

## 2013-07-22 MED ORDER — CEPHALEXIN 500 MG PO CAPS
500.0000 mg | ORAL_CAPSULE | Freq: Four times a day (QID) | ORAL | Status: DC
Start: 2013-07-22 — End: 2016-05-22

## 2013-07-22 NOTE — Discharge Instructions (Signed)
Take all of medicine as directed, drink lots of fluids, see your doctor if further problems. °

## 2013-07-22 NOTE — ED Provider Notes (Signed)
CSN: [id number]     Arrival date & time 07/22/13  0809 History   First MD Initiated Contact with Patient 07/22/13 [phone]     Chief Complaint  Patient presents with  . Abdominal Pain   (Consider location/radiation/quality/duration/timing/severity/associated sxs/prior Treatment) Patient is a 73 y.o. female presenting with dysuria. The history is provided by the patient.  Dysuria Pain quality:  Burning Pain severity:  Moderate Onset quality:  Gradual Duration:  5 days Progression:  Worsening Chronicity:  New Recent urinary tract infections: no   Relieved by:  None tried Worsened by:  Nothing tried Associated symptoms: abdominal pain   Associated symptoms: no fever, no flank pain, no nausea, no vaginal discharge and no vomiting     Past Medical History  Diagnosis Date  . Hypertension     Refractory / Severe  . Chest pain   . Dyspnea   . Diabetes mellitus     type 2  . Dyslipidemia    No past surgical history on file. Family History  Problem Relation Age of Onset  . Heart attack Father 79  . Cancer Mother 38    Urinary Tract   History  Substance Use Topics  . Smoking status: Never Smoker   . Smokeless tobacco: Not on file  . Alcohol Use: No   OB History   Grav Para Term Preterm Abortions TAB SAB Ect Mult Living                 Review of Systems  Constitutional: Negative.  Negative for fever.  Gastrointestinal: Positive for abdominal pain. Negative for nausea and vomiting.  Genitourinary: Positive for dysuria, urgency and frequency. Negative for flank pain, vaginal bleeding and vaginal discharge.  Musculoskeletal: Negative.     Allergies  Review of patient's allergies indicates no known allergies.  Home Medications   Prior to Admission medications   Medication Sig Start Date End Date Taking? Authorizing Provider  amLODipine-olmesartan (AZOR) 10-40 MG per tablet Take 1 tablet by mouth daily.    Historical Provider, MD  carvedilol (COREG) 25 MG tablet Take 25  mg by mouth 2 (two) times daily with a meal.    Historical Provider, MD  ciprofloxacin (CIPRO) 500 MG tablet Take 1 tablet (500 mg total) by mouth 2 (two) times daily. 11/17/12   Charles B. Karle Starch, MD  cloNIDine (CATAPRES) 0.3 MG tablet Take 0.3 mg by mouth 3 (three) times daily.    Historical Provider, MD  HYDROcodone-acetaminophen (NORCO/VICODIN) 5-325 MG per tablet Take 1 tablet by mouth every 6 (six) hours as needed for pain. 11/17/12   Charles B. Karle Starch, MD  metFORMIN (GLUCOPHAGE) 1000 MG tablet Take 1,000 mg by mouth 2 (two) times daily with a meal.    Historical Provider, MD  Multiple Vitamin (MULTIVITAMIN) tablet Take 1 tablet by mouth daily.    Historical Provider, MD  pioglitazone (ACTOS) 45 MG tablet Take 45 mg by mouth daily.    Historical Provider, MD  rosuvastatin (CRESTOR) 10 MG tablet Take 10 mg by mouth once a week.     Historical Provider, MD   BP 175/70  Pulse 72  Temp(Src) 98.4 F (36.9 C) (Oral)  Resp 16  SpO2 98% Physical Exam  Nursing note and vitals reviewed. Constitutional: She is oriented to person, place, and time. She appears well-developed and well-nourished.  Abdominal: Soft. Bowel sounds are normal. She exhibits no distension and no mass. There is no hepatosplenomegaly. There is tenderness in the suprapubic area. There is no rebound, no guarding  and no CVA tenderness. No hernia.    Neurological: She is alert and oriented to person, place, and time.  Skin: Skin is warm and dry.    ED Course  Procedures (including critical care time) Labs Review Labs Reviewed  POCT URINALYSIS DIP (DEVICE) - Abnormal; Notable for the following:    Hgb urine dipstick MODERATE (*)    Leukocytes, UA SMALL (*)    All other components within normal limits    Imaging Review No results found.   MDM   1. UTI (lower urinary tract infection)        Billy Fischer, MD 07/22/13 [phone]

## 2013-07-22 NOTE — ED Notes (Signed)
Pt  Reports  Pain         Lower   abd   Pain   Suprapubic  Area    X  4  Days      Pain  When  She  Urinates  As  Well        Pt  Ambulates      To  Room        With a  Slow  Steady gait

## 2014-03-09 DIAGNOSIS — E118 Type 2 diabetes mellitus with unspecified complications: Secondary | ICD-10-CM | POA: Diagnosis not present

## 2014-03-09 DIAGNOSIS — I10 Essential (primary) hypertension: Secondary | ICD-10-CM | POA: Diagnosis not present

## 2014-04-03 DIAGNOSIS — E780 Pure hypercholesterolemia: Secondary | ICD-10-CM | POA: Diagnosis not present

## 2014-04-03 DIAGNOSIS — E118 Type 2 diabetes mellitus with unspecified complications: Secondary | ICD-10-CM | POA: Diagnosis not present

## 2014-04-03 DIAGNOSIS — M2550 Pain in unspecified joint: Secondary | ICD-10-CM | POA: Diagnosis not present

## 2014-04-03 DIAGNOSIS — I10 Essential (primary) hypertension: Secondary | ICD-10-CM | POA: Diagnosis not present

## 2014-05-18 ENCOUNTER — Other Ambulatory Visit (HOSPITAL_COMMUNITY): Payer: Self-pay | Admitting: Internal Medicine

## 2014-05-18 DIAGNOSIS — Z1231 Encounter for screening mammogram for malignant neoplasm of breast: Secondary | ICD-10-CM

## 2014-06-19 ENCOUNTER — Ambulatory Visit (HOSPITAL_COMMUNITY)
Admission: RE | Admit: 2014-06-19 | Discharge: 2014-06-19 | Disposition: A | Discharge status: Home / Self Care | Payer: Medicare Other | Source: Ambulatory Visit | Attending: Internal Medicine | Admitting: Internal Medicine

## 2014-06-19 ENCOUNTER — Ambulatory Visit (HOSPITAL_COMMUNITY): Payer: Medicaid Other

## 2014-06-19 DIAGNOSIS — Z1231 Encounter for screening mammogram for malignant neoplasm of breast: Secondary | ICD-10-CM | POA: Diagnosis not present

## 2014-07-13 DIAGNOSIS — M8580 Other specified disorders of bone density and structure, unspecified site: Secondary | ICD-10-CM | POA: Diagnosis not present

## 2014-07-13 DIAGNOSIS — M1120 Other chondrocalcinosis, unspecified site: Secondary | ICD-10-CM | POA: Diagnosis not present

## 2014-07-13 DIAGNOSIS — M25552 Pain in left hip: Secondary | ICD-10-CM | POA: Diagnosis not present

## 2014-09-28 DIAGNOSIS — E119 Type 2 diabetes mellitus without complications: Secondary | ICD-10-CM | POA: Diagnosis not present

## 2014-09-28 DIAGNOSIS — I10 Essential (primary) hypertension: Secondary | ICD-10-CM | POA: Diagnosis not present

## 2014-10-14 DIAGNOSIS — Z23 Encounter for immunization: Secondary | ICD-10-CM | POA: Diagnosis not present

## 2014-10-14 DIAGNOSIS — I10 Essential (primary) hypertension: Secondary | ICD-10-CM | POA: Diagnosis not present

## 2014-10-14 DIAGNOSIS — M8580 Other specified disorders of bone density and structure, unspecified site: Secondary | ICD-10-CM | POA: Diagnosis not present

## 2014-10-14 DIAGNOSIS — E119 Type 2 diabetes mellitus without complications: Secondary | ICD-10-CM | POA: Diagnosis not present

## 2014-10-14 DIAGNOSIS — E780 Pure hypercholesterolemia: Secondary | ICD-10-CM | POA: Diagnosis not present

## 2014-10-15 ENCOUNTER — Other Ambulatory Visit: Payer: Self-pay | Admitting: Internal Medicine

## 2014-10-15 DIAGNOSIS — M25552 Pain in left hip: Secondary | ICD-10-CM

## 2014-10-24 ENCOUNTER — Ambulatory Visit
Admission: RE | Admit: 2014-10-24 | Discharge: 2014-10-24 | Disposition: A | Discharge status: Home / Self Care | Payer: Medicare Other | Source: Ambulatory Visit | Attending: Internal Medicine | Admitting: Internal Medicine

## 2014-10-24 DIAGNOSIS — M25552 Pain in left hip: Secondary | ICD-10-CM

## 2014-10-24 DIAGNOSIS — M160 Bilateral primary osteoarthritis of hip: Secondary | ICD-10-CM | POA: Diagnosis not present

## 2014-11-25 DIAGNOSIS — M1612 Unilateral primary osteoarthritis, left hip: Secondary | ICD-10-CM | POA: Diagnosis not present

## 2014-11-25 DIAGNOSIS — M545 Low back pain: Secondary | ICD-10-CM | POA: Diagnosis not present

## 2014-12-23 DIAGNOSIS — M1612 Unilateral primary osteoarthritis, left hip: Secondary | ICD-10-CM | POA: Diagnosis not present

## 2015-04-16 DIAGNOSIS — M8580 Other specified disorders of bone density and structure, unspecified site: Secondary | ICD-10-CM | POA: Diagnosis not present

## 2015-04-16 DIAGNOSIS — E119 Type 2 diabetes mellitus without complications: Secondary | ICD-10-CM | POA: Diagnosis not present

## 2015-04-16 DIAGNOSIS — I10 Essential (primary) hypertension: Secondary | ICD-10-CM | POA: Diagnosis not present

## 2015-04-26 DIAGNOSIS — E119 Type 2 diabetes mellitus without complications: Secondary | ICD-10-CM | POA: Diagnosis not present

## 2015-04-26 DIAGNOSIS — N390 Urinary tract infection, site not specified: Secondary | ICD-10-CM | POA: Diagnosis not present

## 2015-04-26 DIAGNOSIS — I10 Essential (primary) hypertension: Secondary | ICD-10-CM | POA: Diagnosis not present

## 2015-04-26 DIAGNOSIS — R05 Cough: Secondary | ICD-10-CM | POA: Diagnosis not present

## 2015-04-26 DIAGNOSIS — E7800 Pure hypercholesterolemia, unspecified: Secondary | ICD-10-CM | POA: Diagnosis not present

## 2015-07-05 DIAGNOSIS — I10 Essential (primary) hypertension: Secondary | ICD-10-CM | POA: Diagnosis not present

## 2015-07-12 ENCOUNTER — Other Ambulatory Visit: Payer: Self-pay

## 2015-07-12 DIAGNOSIS — I10 Essential (primary) hypertension: Secondary | ICD-10-CM | POA: Diagnosis not present

## 2015-07-12 DIAGNOSIS — Z1231 Encounter for screening mammogram for malignant neoplasm of breast: Secondary | ICD-10-CM

## 2015-07-19 ENCOUNTER — Ambulatory Visit
Admission: RE | Admit: 2015-07-19 | Discharge: 2015-07-19 | Disposition: A | Discharge status: Home / Self Care | Payer: Medicare Other | Source: Ambulatory Visit

## 2015-07-19 DIAGNOSIS — Z1231 Encounter for screening mammogram for malignant neoplasm of breast: Secondary | ICD-10-CM | POA: Diagnosis not present

## 2015-07-22 DIAGNOSIS — E119 Type 2 diabetes mellitus without complications: Secondary | ICD-10-CM | POA: Diagnosis not present

## 2015-07-22 DIAGNOSIS — Z7984 Long term (current) use of oral hypoglycemic drugs: Secondary | ICD-10-CM | POA: Diagnosis not present

## 2015-07-22 DIAGNOSIS — H524 Presbyopia: Secondary | ICD-10-CM | POA: Diagnosis not present

## 2015-07-27 DIAGNOSIS — E7800 Pure hypercholesterolemia, unspecified: Secondary | ICD-10-CM | POA: Diagnosis not present

## 2015-07-27 DIAGNOSIS — E119 Type 2 diabetes mellitus without complications: Secondary | ICD-10-CM | POA: Diagnosis not present

## 2015-07-27 DIAGNOSIS — H269 Unspecified cataract: Secondary | ICD-10-CM | POA: Diagnosis not present

## 2015-07-27 DIAGNOSIS — I10 Essential (primary) hypertension: Secondary | ICD-10-CM | POA: Diagnosis not present

## 2015-08-02 DIAGNOSIS — H40033 Anatomical narrow angle, bilateral: Secondary | ICD-10-CM | POA: Diagnosis not present

## 2015-08-02 DIAGNOSIS — H2513 Age-related nuclear cataract, bilateral: Secondary | ICD-10-CM | POA: Diagnosis not present

## 2015-08-02 DIAGNOSIS — H02423 Myogenic ptosis of bilateral eyelids: Secondary | ICD-10-CM | POA: Diagnosis not present

## 2015-08-16 DIAGNOSIS — H2512 Age-related nuclear cataract, left eye: Secondary | ICD-10-CM | POA: Diagnosis not present

## 2015-08-19 DIAGNOSIS — H2512 Age-related nuclear cataract, left eye: Secondary | ICD-10-CM | POA: Diagnosis not present

## 2015-09-13 DIAGNOSIS — H2511 Age-related nuclear cataract, right eye: Secondary | ICD-10-CM | POA: Diagnosis not present

## 2015-09-16 DIAGNOSIS — H2511 Age-related nuclear cataract, right eye: Secondary | ICD-10-CM | POA: Diagnosis not present

## 2015-09-16 DIAGNOSIS — H2512 Age-related nuclear cataract, left eye: Secondary | ICD-10-CM | POA: Diagnosis not present

## 2015-09-16 DIAGNOSIS — H25011 Cortical age-related cataract, right eye: Secondary | ICD-10-CM | POA: Diagnosis not present

## 2015-09-16 DIAGNOSIS — H268 Other specified cataract: Secondary | ICD-10-CM | POA: Diagnosis not present

## 2015-10-06 DIAGNOSIS — H2703 Aphakia, bilateral: Secondary | ICD-10-CM | POA: Diagnosis not present

## 2015-10-06 DIAGNOSIS — H5203 Hypermetropia, bilateral: Secondary | ICD-10-CM | POA: Diagnosis not present

## 2015-10-18 DIAGNOSIS — I10 Essential (primary) hypertension: Secondary | ICD-10-CM | POA: Diagnosis not present

## 2015-10-18 DIAGNOSIS — E119 Type 2 diabetes mellitus without complications: Secondary | ICD-10-CM | POA: Diagnosis not present

## 2015-10-25 DIAGNOSIS — E7800 Pure hypercholesterolemia, unspecified: Secondary | ICD-10-CM | POA: Diagnosis not present

## 2015-10-25 DIAGNOSIS — E119 Type 2 diabetes mellitus without complications: Secondary | ICD-10-CM | POA: Diagnosis not present

## 2015-10-25 DIAGNOSIS — I10 Essential (primary) hypertension: Secondary | ICD-10-CM | POA: Diagnosis not present

## 2015-10-25 DIAGNOSIS — M810 Age-related osteoporosis without current pathological fracture: Secondary | ICD-10-CM | POA: Diagnosis not present

## 2015-10-25 DIAGNOSIS — M8580 Other specified disorders of bone density and structure, unspecified site: Secondary | ICD-10-CM | POA: Diagnosis not present

## 2015-10-28 DIAGNOSIS — H40013 Open angle with borderline findings, low risk, bilateral: Secondary | ICD-10-CM | POA: Diagnosis not present

## 2015-12-10 DIAGNOSIS — H401133 Primary open-angle glaucoma, bilateral, severe stage: Secondary | ICD-10-CM | POA: Diagnosis not present

## 2015-12-10 DIAGNOSIS — Z961 Presence of intraocular lens: Secondary | ICD-10-CM | POA: Diagnosis not present

## 2015-12-21 ENCOUNTER — Other Ambulatory Visit (HOSPITAL_COMMUNITY): Payer: Self-pay | Admitting: Internal Medicine

## 2015-12-21 ENCOUNTER — Ambulatory Visit (HOSPITAL_COMMUNITY)
Admission: RE | Admit: 2015-12-21 | Discharge: 2015-12-21 | Disposition: A | Discharge status: Home / Self Care | Payer: Medicare Other | Source: Ambulatory Visit | Attending: Family Medicine | Admitting: Family Medicine

## 2015-12-21 DIAGNOSIS — M79661 Pain in right lower leg: Secondary | ICD-10-CM | POA: Diagnosis not present

## 2015-12-21 DIAGNOSIS — M79604 Pain in right leg: Secondary | ICD-10-CM | POA: Insufficient documentation

## 2015-12-21 DIAGNOSIS — M7989 Other specified soft tissue disorders: Secondary | ICD-10-CM

## 2015-12-21 NOTE — Progress Notes (Addendum)
**  Preliminary report by tech**  Right lower extremity venous duplex completed. There is no evidence of deep or superficial vein thrombosis involving the right lower extremity. All visualized vessels appear patent and compressible. There is no evidence of a Baker's cyst on the right. Results were given to Dr. Ashby Dawes.  12/21/15 5:02 PM Crystal Fowler RVT

## 2016-01-28 DIAGNOSIS — Z961 Presence of intraocular lens: Secondary | ICD-10-CM | POA: Diagnosis not present

## 2016-01-28 DIAGNOSIS — H401133 Primary open-angle glaucoma, bilateral, severe stage: Secondary | ICD-10-CM | POA: Diagnosis not present

## 2016-02-15 DIAGNOSIS — Z961 Presence of intraocular lens: Secondary | ICD-10-CM | POA: Diagnosis not present

## 2016-04-21 DIAGNOSIS — E119 Type 2 diabetes mellitus without complications: Secondary | ICD-10-CM | POA: Diagnosis not present

## 2016-04-21 DIAGNOSIS — M8580 Other specified disorders of bone density and structure, unspecified site: Secondary | ICD-10-CM | POA: Diagnosis not present

## 2016-04-21 DIAGNOSIS — E559 Vitamin D deficiency, unspecified: Secondary | ICD-10-CM | POA: Diagnosis not present

## 2016-04-21 DIAGNOSIS — I10 Essential (primary) hypertension: Secondary | ICD-10-CM | POA: Diagnosis not present

## 2016-04-21 DIAGNOSIS — Z1321 Encounter for screening for nutritional disorder: Secondary | ICD-10-CM | POA: Diagnosis not present

## 2016-04-28 DIAGNOSIS — E119 Type 2 diabetes mellitus without complications: Secondary | ICD-10-CM | POA: Diagnosis not present

## 2016-04-28 DIAGNOSIS — Z0000 Encounter for general adult medical examination without abnormal findings: Secondary | ICD-10-CM | POA: Diagnosis not present

## 2016-04-28 DIAGNOSIS — I10 Essential (primary) hypertension: Secondary | ICD-10-CM | POA: Diagnosis not present

## 2016-04-28 DIAGNOSIS — R0602 Shortness of breath: Secondary | ICD-10-CM | POA: Diagnosis not present

## 2016-04-28 DIAGNOSIS — R0600 Dyspnea, unspecified: Secondary | ICD-10-CM | POA: Diagnosis not present

## 2016-04-28 DIAGNOSIS — E7800 Pure hypercholesterolemia, unspecified: Secondary | ICD-10-CM | POA: Diagnosis not present

## 2016-05-09 ENCOUNTER — Telehealth: Payer: Self-pay | Admitting: Cardiology

## 2016-05-09 NOTE — Telephone Encounter (Signed)
Received records from [facility] for appointment with Dr Percival Spanish on 05/19/16.  Records put with Dr Hochrein's schedule on 05/19/16. lp

## 2016-05-10 ENCOUNTER — Encounter: Payer: Self-pay | Admitting: Cardiology

## 2016-05-12 ENCOUNTER — Encounter (HOSPITAL_COMMUNITY): Payer: Self-pay

## 2016-05-12 DIAGNOSIS — Z79899 Other long term (current) drug therapy: Secondary | ICD-10-CM | POA: Insufficient documentation

## 2016-05-12 DIAGNOSIS — Z7984 Long term (current) use of oral hypoglycemic drugs: Secondary | ICD-10-CM | POA: Diagnosis not present

## 2016-05-12 DIAGNOSIS — E119 Type 2 diabetes mellitus without complications: Secondary | ICD-10-CM | POA: Insufficient documentation

## 2016-05-12 DIAGNOSIS — R040 Epistaxis: Secondary | ICD-10-CM | POA: Insufficient documentation

## 2016-05-12 DIAGNOSIS — I10 Essential (primary) hypertension: Secondary | ICD-10-CM | POA: Diagnosis not present

## 2016-05-12 MED ORDER — OXYMETAZOLINE HCL 0.05 % NA SOLN
1.0000 | Freq: Once | NASAL | Status: AC
  Administered 2016-05-13: 1 via NASAL
  Filled 2016-05-12: qty 15

## 2016-05-12 NOTE — ED Triage Notes (Signed)
Pt states that she was watching TV this evening and suddenly her L nostril started bleeding. Bleeding is controlled at this time. Pt denies any history of nosebleeds. Denies any other symptoms. A&Ox4. Ambulatory.

## 2016-05-13 ENCOUNTER — Emergency Department (HOSPITAL_COMMUNITY)
Admission: EM | Admit: 2016-05-13 | Discharge: 2016-05-13 | Disposition: A | Discharge status: Home / Self Care | Payer: Medicare Other | Attending: Emergency Medicine | Admitting: Emergency Medicine

## 2016-05-13 DIAGNOSIS — R040 Epistaxis: Secondary | ICD-10-CM

## 2016-05-13 NOTE — Discharge Instructions (Signed)

## 2016-05-13 NOTE — ED Provider Notes (Signed)
[facility] DEPT Provider Note   CSN: [id number] Arrival date & time: 05/12/16  2029   By signing my name below, I, Delton Prairie, attest that this documentation has been prepared under the direction and in the presence of Varney Biles, MD  Electronically Signed: Delton Prairie, ED Scribe. 05/13/16. 2:33 AM.   History   Chief Complaint Chief Complaint  Patient presents with  . Epistaxis   The history is provided by the patient. No language interpreter was used.    HPI Comments:  Crystal Fowler is a 76 y.o. female, with a PMHx of DM and HTN, who presents to the Emergency Department complaining of an acute onset, moderate nosebleed from the left naris onset 8 PM yesterday. She reports associated blood clots. Pt states she sneezed and her nose suddenly began to bleed. She notes her nosebleed resolved upon arrival to the ED. Pt applied pressure with relief. Pt denies blood thinner use, trauma to her nose, any URI symptoms, recent nose picking or any other associated symptoms. No other complaints noted.   Past Medical History:  Diagnosis Date  . Chest pain   . Diabetes mellitus    type 2  . Dyslipidemia   . Dyspnea   . Hypertension    Refractory / Severe    Patient Active Problem List   Diagnosis Date Noted  . HYPERCHOLESTEROLEMIA, PURE 10/17/2006  . DEGENERATIVE JOINT DISEASE, MODERATE 10/17/2006  . DIABETES MELLITUS, TYPE II 10/16/2006  . GOUT 10/16/2006  . HYPERTENSION 10/16/2006    History reviewed. No pertinent surgical history.  OB History    No data available       Home Medications    Prior to Admission medications   Medication Sig Start Date End Date Taking? Authorizing Provider  amLODipine-olmesartan (AZOR) 10-40 MG per tablet Take 1 tablet by mouth daily.    Historical Provider, MD  carvedilol (COREG) 25 MG tablet Take 25 mg by mouth 2 (two) times daily with a meal.    Historical Provider, MD  cephALEXin (KEFLEX) 500 MG capsule Take 1 capsule (500 mg  total) by mouth 4 (four) times daily. Take all of medicine and drink lots of fluids 07/22/13   Billy Fischer, MD  ciprofloxacin (CIPRO) 500 MG tablet Take 1 tablet (500 mg total) by mouth 2 (two) times daily. 11/17/12   Calvert Cantor, MD  cloNIDine (CATAPRES) 0.3 MG tablet Take 0.3 mg by mouth 3 (three) times daily.    Historical Provider, MD  HYDROcodone-acetaminophen (NORCO/VICODIN) 5-325 MG per tablet Take 1 tablet by mouth every 6 (six) hours as needed for pain. 11/17/12   Calvert Cantor, MD  metFORMIN (GLUCOPHAGE) 1000 MG tablet Take 1,000 mg by mouth 2 (two) times daily with a meal.    Historical Provider, MD  Multiple Vitamin (MULTIVITAMIN) tablet Take 1 tablet by mouth daily.    Historical Provider, MD  pioglitazone (ACTOS) 45 MG tablet Take 45 mg by mouth daily.    Historical Provider, MD  rosuvastatin (CRESTOR) 10 MG tablet Take 10 mg by mouth once a week.     Historical Provider, MD    Family History Family History  Problem Relation Age of Onset  . Heart attack Father 49  . Cancer Mother 66    Urinary Tract    Social History Social History  Substance Use Topics  . Smoking status: Never Smoker  . Smokeless tobacco: Never Used  . Alcohol use No     Allergies   Patient has no known allergies.  Review of Systems Review of Systems 10 systems reviewed and all are negative for acute change except as noted in the HPI.   Physical Exam Updated Vital Signs BP 125/76 (BP Location: Left Arm)   Pulse 62   Temp 98.4 F (36.9 C) (Oral)   Resp 18   Ht 5\' 2"  (1.575 m)   Wt 188 lb (85.3 kg)   SpO2 95%   BMI 34.39 kg/m   Physical Exam  Constitutional: She is oriented to person, place, and time. She appears well-developed and well-nourished.  HENT:  Head: Normocephalic.  Left nare has anterior medial bleeding source, which is not showing any active bleeding not. There is some dried blood around nare right now.   Eyes: EOM are normal.  Neck: Normal range of motion.    Pulmonary/Chest: Effort normal.  Abdominal: She exhibits no distension.  Musculoskeletal: Normal range of motion.  Neurological: She is alert and oriented to person, place, and time.  Psychiatric: She has a normal mood and affect.  Nursing note and vitals reviewed.   ED Treatments / Results  DIAGNOSTIC STUDIES:  Oxygen Saturation is 95% on RA, normal by my interpretation.    COORDINATION OF CARE:  2:32 AM Discussed treatment plan with pt at bedside and pt agreed to plan.  Labs (all labs ordered are listed, but only abnormal results are displayed) Labs Reviewed - No data to display  EKG  EKG Interpretation None       Radiology No results found.  Procedures Procedures (including critical care time)  Medications Ordered in ED Medications  oxymetazoline (AFRIN) 0.05 % nasal spray 1 spray (1 spray Each Nare Given 05/13/16 0150)     Initial Impression / Assessment and Plan / ED Course  I have reviewed the triage vital signs and the nursing notes.  Pertinent labs & imaging results that were available during my care of the patient were reviewed by me and considered in my medical decision making (see chart for details).     Pt had epistaxis, resolved with afrin. Pt wants to go home - d/c done after discussing strict ER return precautions. Pt is not on anticoagulation.  Final Clinical Impressions(s) / ED Diagnoses   Final diagnoses:  Acute anterior epistaxis    New Prescriptions New Prescriptions   No medications on file  I personally performed the services described in this documentation, which was scribed in my presence. The recorded information has been reviewed and is accurate.    Varney Biles, MD 05/13/16 [phone]

## 2016-05-18 NOTE — Progress Notes (Signed)
Cardiology Office Note   Date:  05/21/2016   ID:  TIMMIA COGBURN, DOB Nov 25, 1940, MRN [id number]  PCP:  Jani Gravel, MD  Cardiologist:   Minus Breeding, MD  Referring:  Jani Gravel, MD  Chief Complaint  Patient presents with  . Shortness of Breath  . Dizziness      History of Present Illness: Crystal Fowler is a 76 y.o. female who presents for Evaluation of shortness of breath.  She did have an echo in 2010.   She did have some mild pulmonary HTN.  Otherwise she's not had any cardiac history. She has had some difficult to control hypertension. In fact she had epistaxis and was in the emergency room recently and I reviewed these records. Her blood pressure was elevated. She says at home her systolics typically in the 140s to 170 range. She's compliant with medications and salt restriction. She is thought to have sleep apnea and is scheduled to have a sleep study.  She has had increasing shortness of breath. This is with mild activities such as walking through [place]. She's not describing PND or orthopnea. She's not having new palpitations, presyncope or syncope. She's had some mild leg swelling.    Past Medical History:  Diagnosis Date  . Diabetes mellitus    type 2  . Dyslipidemia   . Gout   . Hypertension    Refractory / Severe    Past Surgical History:  Procedure Laterality Date  . ABDOMINAL HYSTERECTOMY     partial  . CATARACT EXTRACTION       Current Outpatient Prescriptions  Medication Sig Dispense Refill  . amLODipine-olmesartan (AZOR) 10-40 MG per tablet Take 1 tablet by mouth daily.    . carvedilol (COREG) 25 MG tablet Take 25 mg by mouth 2 (two) times daily with a meal.    . cephALEXin (KEFLEX) 500 MG capsule Take 1 capsule (500 mg total) by mouth 4 (four) times daily. Take all of medicine and drink lots of fluids 20 capsule 0  . ciprofloxacin (CIPRO) 500 MG tablet Take 1 tablet (500 mg total) by mouth 2 (two) times daily. 14 tablet 0  . cloNIDine (CATAPRES) 0.3 MG  tablet Take 0.3 mg by mouth 3 (three) times daily.    . hydrALAZINE (APRESOLINE) 10 MG tablet TK 1 T PO BID  4  . HYDROcodone-acetaminophen (NORCO/VICODIN) 5-325 MG per tablet Take 1 tablet by mouth every 6 (six) hours as needed for pain. 20 tablet 0  . metFORMIN (GLUCOPHAGE) 1000 MG tablet Take 1,000 mg by mouth 2 (two) times daily with a meal.    . Multiple Vitamin (MULTIVITAMIN) tablet Take 1 tablet by mouth daily.    . pioglitazone (ACTOS) 45 MG tablet Take 45 mg by mouth daily.    . rosuvastatin (CRESTOR) 10 MG tablet Take 10 mg by mouth once a week.     . simvastatin (ZOCOR) 10 MG tablet TK 1 T PO QD IN THE EVE  11   No current facility-administered medications for this visit.     Allergies:   Patient has no known allergies.    Social History:  The patient  reports that she has never smoked. She has never used smokeless tobacco. She reports that she does not drink alcohol or use drugs.   Family History:  The patient's family history includes Cancer (age of onset: 50) in her mother; Heart attack (age of onset: 8) in her father.    ROS:  Please see the  history of present illness.   Otherwise, review of systems are positive for numbness in her right leg, right ankle swelling.   All other systems are reviewed and negative.    PHYSICAL EXAM: VS:  BP (!) 158/81   Pulse 63   Ht 5\' 2"  (1.575 m)   Wt 183 lb (83 kg)   BMI 33.47 kg/m  , BMI Body mass index is 33.47 kg/m. GENERAL:  Well appearing HEENT:  Pupils equal round and reactive, fundi not visualized, oral mucosa unremarkable, edentulous upper NECK:  No jugular venous distention, waveform within normal limits, carotid upstroke brisk and symmetric, no bruits, no thyromegaly LYMPHATICS:  No cervical, inguinal adenopathy LUNGS:  Clear to auscultation bilaterally BACK:  No CVA tenderness CHEST:  Unremarkable HEART:  PMI not displaced or sustained,S1 and S2 within normal limits, no S3, no S4, no clicks, no rubs, 2 out of 6 brief  systolic murmur heard best at the right upper sternal border and nonradiating, no diastolic murmurs ABD:  Flat, positive bowel sounds normal in frequency in pitch, no bruits, no rebound, no guarding, no midline pulsatile mass, no hepatomegaly, no splenomegaly EXT:  2 plus pulses throughout, mild ankle edema right greater than left, no cyanosis no clubbing SKIN:  No rashes no nodules NEURO:  Cranial nerves II through XII grossly intact, motor grossly intact throughout PSYCH:  Cognitively intact, oriented to person place and time    EKG:  EKG is ordered today. The ekg ordered today demonstrates sinus rhythm, rate 63, axis within normal limits, intervals within normal limits, no acute ST-T wave changes.   Recent Labs: 05/19/2016: Brain Natriuretic Peptide 75.5    Lipid Panel No results found for: CHOL, TRIG, HDL, CHOLHDL, VLDL, LDLCALC, LDLDIRECT    Wt Readings from Last 3 Encounters:  05/19/16 183 lb (83 kg)  05/12/16 188 lb (85.3 kg)      Other studies Reviewed: Additional studies/ records that were reviewed today include: Office records/called for lab results. Review of the above records demonstrates:  Please see elsewhere in the note.     ASSESSMENT AND PLAN:  DYSPNEA:  This is the biggest complaint.  I will start with a BNP level and echo.  For now we talked about salt and fluid restriction.  I will see her back after these results.  PULM HTN:  This will be followed up with the echo as above.   HTN:  BP is mildly elevated today.  I will consider changing her meds based on further readings.  She is on high meds dose meds.  I did review the previous MRI result from 2002 and there was no evidence of any renal artery disease. It appears that her previous workup for secondary causes has been normal.     Current medicines are reviewed at length with the patient today.  The patient does not have concerns regarding medicines.  The following changes have been made:  no  change  Labs/ tests ordered today include:   Orders Placed This Encounter  Procedures  . B Nat Peptide  . EKG 12-Lead  . ECHOCARDIOGRAM COMPLETE     Disposition:   FU with me after the echo and BNP.     Signed, Minus Breeding, MD  05/21/2016 8:49 PM    [place]

## 2016-05-19 ENCOUNTER — Ambulatory Visit (INDEPENDENT_AMBULATORY_CARE_PROVIDER_SITE_OTHER): Payer: Medicare Other | Admitting: Cardiology

## 2016-05-19 ENCOUNTER — Encounter: Payer: Self-pay | Admitting: Cardiology

## 2016-05-19 VITALS — BP 158/81 | HR 63 | Ht 62.0 in | Wt 183.0 lb

## 2016-05-19 DIAGNOSIS — R0602 Shortness of breath: Secondary | ICD-10-CM | POA: Diagnosis not present

## 2016-05-19 DIAGNOSIS — I272 Pulmonary hypertension, unspecified: Secondary | ICD-10-CM

## 2016-05-19 DIAGNOSIS — I10 Essential (primary) hypertension: Secondary | ICD-10-CM

## 2016-05-19 NOTE — Patient Instructions (Signed)
Medication Instructions:  Continue current medications  Labwork: BNP  Testing/Procedures: Your physician has requested that you have an echocardiogram. Echocardiography is a painless test that uses sound waves to create images of your heart. It provides your doctor with information about the size and shape of your heart and how well your heart's chambers and valves are working. This procedure takes approximately one hour. There are no restrictions for this procedure.  Follow-Up: Your physician recommends that you schedule a follow-up appointment in: After Echo   Any Other Special Instructions Will Be Listed Below (If Applicable).   If you need a refill on your cardiac medications before your next appointment, please call your pharmacy.

## 2016-05-20 LAB — BRAIN NATRIURETIC PEPTIDE: Brain Natriuretic Peptide: 75.5 pg/mL (ref <100)

## 2016-05-21 ENCOUNTER — Encounter: Payer: Self-pay | Admitting: Cardiology

## 2016-05-21 DIAGNOSIS — I272 Pulmonary hypertension, unspecified: Secondary | ICD-10-CM | POA: Insufficient documentation

## 2016-05-21 DIAGNOSIS — R0602 Shortness of breath: Secondary | ICD-10-CM | POA: Insufficient documentation

## 2016-05-22 ENCOUNTER — Ambulatory Visit (INDEPENDENT_AMBULATORY_CARE_PROVIDER_SITE_OTHER): Payer: Medicare Other | Admitting: Neurology

## 2016-05-22 ENCOUNTER — Encounter: Payer: Self-pay | Admitting: Neurology

## 2016-05-22 VITALS — BP 179/89 | HR 71 | Resp 20 | Ht 62.0 in | Wt 186.0 lb

## 2016-05-22 DIAGNOSIS — R0602 Shortness of breath: Secondary | ICD-10-CM | POA: Diagnosis not present

## 2016-05-22 DIAGNOSIS — I10 Essential (primary) hypertension: Secondary | ICD-10-CM | POA: Diagnosis not present

## 2016-05-22 DIAGNOSIS — Z9189 Other specified personal risk factors, not elsewhere classified: Secondary | ICD-10-CM

## 2016-05-22 DIAGNOSIS — R0683 Snoring: Secondary | ICD-10-CM

## 2016-05-22 DIAGNOSIS — E669 Obesity, unspecified: Secondary | ICD-10-CM | POA: Diagnosis not present

## 2016-05-22 DIAGNOSIS — E66811 Obesity, class 1: Secondary | ICD-10-CM

## 2016-05-22 NOTE — Progress Notes (Signed)
SLEEP MEDICINE CLINIC   Provider:  Larey Seat, M D  Primary Care Physician:  Jani Gravel, MD   Referring Provider: Jani Gravel, MD    Chief Complaint  Patient presents with  . New Patient (Initial Visit)    had sleep study 5-6 years ago, snores    HPI:  Crystal Fowler is a 76 y.o. female , seen here as in a referral  from Dr. Maudie Mercury for a sleep consultation,  Chief complaint according to patient : " I wake up choking, fighting for air "    Crystal Fowler, a 76 year old African-American right-handed female, presents today for a sleep consultation referred by Dr. Maudie Mercury. He had mentioned that the patient has a small upper airway, and that she reported eating a loud snorer. He is concerned that she may have sleep apnea. The patient has a past medical history of osteoarthritis, diabetes type 2, gout, obesity, hyperlipidemia, hypertension, osteopenia, she suffered a bout of pancreatitis on 08/24/2008, she also has a diagnosis of a vitamin D deficiency. Over the last 2 weeks she has felt very fatigued and tired and is no longer restored or refreshed when waking up in the morning. She reported that she suffered a nosebleed and had to visit the emergency room. Her blood pressure was 474 systolic that evening. This is an additional reason for concerned about untreated sleep apnea.  Sleep habits are as follows: The patient sleeps alone and lives with her daughter and granddaughter.  The patient changed her bedtime from 11 PM recently to 9 PM all this was in the last 14 days. She watches TV in the bedroom, and falls asleep promptly. She has a feeling found that provides background noise, the room is cool. She likes to stay in her recliner during the day but during the night she always changes into her bedroom, and sleeps on one pillow usually on her right side or back . The TV may stay on all night and will still be on when she rises in the morning. She reports that she does wake up during the night, usually  she wakes up between 2 and 2:30 AM but is not sure what wakes her. She usually can go back to sleep. She also will wake up between 4 and 5 AM but it is not for a bathroom break. She reports that she feels short of breath and that her nose is often congested causing her to breathe through the mouth. Mouth and nose are often very dry. She rises every morning at 6 AM, she wakes up spontaneously at that time without alarm. She feels very tired and not restored or refreshed from her nocturnal sleep. She denies dizziness, palpitations, diaphoresis or nausea.  Sleep medical history and family sleep history: The patient never underwent a tonsillectomy, she is treated for gout, hypertension, diabetes. I reviewed the patient's current medications which include colchicine, hydrocortisone, calcium, vitamin D, simvastatin, Azor, metformin, hydralazine, clonidine, carvedilol, proglitazone.  Social history: The patient used to work at [employer] in [place], [place] , attended [employer].   Divorced, she has 4 children all adults, she has never used tobacco products and she does not drink alcohol, most days she will drink decaffeinated coffee, one cup in the morning. She does not drink sodas with caffeine, she does also rarely drink iced tea. She drinks fruity punch, not real juices.   Review of Systems: Out of a complete 14 system review, the patient complains of only the following symptoms, and  all other reviewed systems are negative. Loud snoring, choking, nosebleeds with high BP.  No nocturia.  Epworth score 3 , Fatigue severity score 20  , depression score 5/1 5   Social History   Social History  . Marital status: Divorced    Spouse name: N/A  . Number of children: 4  . Years of education: N/A   Occupational History  . Not on file.   Social History Main Topics  . Smoking status: Never Smoker  . Smokeless tobacco: Never Used  . Alcohol use No  . Drug use: No  . Sexual activity: Not on file     Other Topics Concern  . Not on file   Social History Narrative   Granddaughter lives with patient with her two children.     Family History  Problem Relation Age of Onset  . Heart attack Father 46  . Cancer Mother 25    Urinary Tract    Past Medical History:  Diagnosis Date  . Diabetes mellitus    type 2  . Dyslipidemia   . Gout   . Hypertension    Refractory / Severe    Past Surgical History:  Procedure Laterality Date  . ABDOMINAL HYSTERECTOMY     partial  . CATARACT EXTRACTION      Current Outpatient Prescriptions  Medication Sig Dispense Refill  . amLODipine-olmesartan (AZOR) 10-40 MG per tablet Take 1 tablet by mouth daily.    . carvedilol (COREG) 25 MG tablet Take 25 mg by mouth 2 (two) times daily with a meal.    . cloNIDine (CATAPRES) 0.3 MG tablet Take 0.3 mg by mouth 3 (three) times daily.    . hydrALAZINE (APRESOLINE) 10 MG tablet TK 1 T PO BID  4  . HYDROcodone-acetaminophen (NORCO/VICODIN) 5-325 MG per tablet Take 1 tablet by mouth every 6 (six) hours as needed for pain. 20 tablet 0  . metFORMIN (GLUCOPHAGE) 1000 MG tablet Take 1,000 mg by mouth daily with breakfast.     . Multiple Vitamin (MULTIVITAMIN) tablet Take 1 tablet by mouth daily.    . pioglitazone (ACTOS) 45 MG tablet Take 45 mg by mouth daily.    . rosuvastatin (CRESTOR) 10 MG tablet Take 10 mg by mouth once a week.     . simvastatin (ZOCOR) 10 MG tablet TK 1 T PO QD IN THE EVE  11   No current facility-administered medications for this visit.     Allergies as of 05/22/2016  . (No Known Allergies)    Vitals: BP (!) 179/89   Pulse 71   Resp 20   Ht 5\' 2"  (1.575 m)   Wt 186 lb (84.4 kg)   BMI 34.02 kg/m  Last Weight:  Wt Readings from Last 1 Encounters:  05/22/16 186 lb (84.4 kg)   OVF:IEPP mass index is 34.02 kg/m.     Last Height:   Ht Readings from Last 1 Encounters:  05/22/16 5\' 2"  (1.575 m)    Physical exam:  General: The patient is awake, alert and appears not in  acute distress. The patient is well groomed. Head: Normocephalic, atraumatic. Neck is supple. Mallampati 5- macroglossia,  Full set upper dentures , not worn today.   neck circumference: 15.75 . Nasal airflow congested , TMJ is  evident . Retrognathia is seen.  Cardiovascular:  Regular rate and rhythm, without  murmurs or carotid bruit, and without distended neck veins. Respiratory: Lungs are clear to auscultation. Skin:  Without evidence of edema, or rash Trunk:  BMI is 34. The patient's posture is stooped  Neurologic exam : The patient is awake and alert, oriented to place and time.    Attention span & concentration ability appears normal.  Speech is fluent,  without dysarthria, but with  dysphonia .Mood and affect are appropriate.  Cranial nerves: Pupils are equal and briskly reactive to light. Extraocular movements  in vertical and horizontal planes intact and without nystagmus.  Wears corrective lenses.Visual fields by finger perimetry are intact. Hearing to finger rub intact. Facial sensation intact to fine touch. Facial motor strength is symmetric and tongue and uvula move midline. Shoulder shrug was symmetrical.   Motor exam:  Normal tone, muscle bulk and symmetric strength in all extremities. Sensory:  Fine touch, pinprick and vibration were tested in all extremities. Proprioception tested in the upper extremities was normal. Coordination: Rapid alternating movements in the fingers/hands was normal. Finger-to-nose maneuver  normal without evidence of ataxia, dysmetria or tremor. Gait and station: Patient walks without assistive device and is able unassisted to climb up to the exam table. Strength within normal limits.  Stance is stable and normal. Turns with 3-4  Steps. Romberg testing is  negative. Deep tendon reflexes: in the  upper and lower extremities are symmetric and intact. Babinski maneuver response is downgoing.    I would like to thank Dr. Maudie Mercury for allowing me to  participate in this pleasant lady's care, he provided me with laboratory results recently obtained in his own office. These include an HbA1c of 7.0, white blood cell count of 3.3 on the low side, hemoglobin 12, urine negative, HDL cholesterol at 91, triglycerides 43, total cholesterol 157. TSH was 2.13. Normal liver function tests, creatinine 1.0, potassium 4.2, bilirubin 0.8. Additionally  a recent referral for sleep studies as well as for cardiovascular disease evaluation as the patient reported dyspnea with exertion. I do think she is also deconditioned.  Assessment:  After physical and neurologic examination, review of laboratory studies,  Personal review of imaging studies, reports of other /same  Imaging studies, results of polysomnography and / or neurophysiology testing and pre-existing records as far as provided in visit., my assessment is   1) Mrs. Roel, who resides with her grand daughter and her 2 children, knows she snores but she has never been told that she stops breathing at night. At times she has choked or gagged herself out of sleep awake and found herself short of breath. She also has a daytime tendency to be short of breath.  2)obesity- she has not gained but maintained weight over the last 2 years. She was unable to answer when she was at her heaviest. She has been educated in diabetic diet, but feels it is hard for her to implement. I would like for her to establish a daily walking routine and a lower carb diet that would exclude fruit punch. A reduction body mass index to 30 or below would help her to reduce her risk of apnea and probably will reduce her snoring as well. The patient just recently saw her cardiologist and was given a list of low glycemic diet plan.  3) she denies hypersomnia. The patient only endorsed 3 points on an Epworth sleepiness score.  4)cardiac work up: pending.   The patient was advised of the nature of the diagnosed disorder , the treatment options and  the  risks for general health and wellness arising from not treating the condition.   I spent more than 45 minutes of face to face  time with the patient.  Greater than 50% of time was spent in counseling and coordination of care. We have discussed the diagnosis and differential and I answered the patient's questions.    Plan:  Treatment plan and additional workup :  The patient's obstructive sleep apnea workup will include an attended sleep study by split-night protocol. I will not order capnography. I encouraged her to include her dietary changes into her daily regimen. We will meet after the split-night study protocol has been performed and interpreted. Patient is aware that should she present with moderate to severe apnea or apnea associated with hypoxemia a CPAP titration will be the treatment of choice. Since she does have an edentulous upper jaw dental device is not an option for her.   SPLIT study and oximetry, no capnography/  RV after study /  Larey Seat, MD 0/[id number]/9444, 6:19 PM  Certified in Neurology by ABPN Certified in [place] by [facility] [address] [place], [place] [address]

## 2016-05-23 DIAGNOSIS — I10 Essential (primary) hypertension: Secondary | ICD-10-CM | POA: Diagnosis not present

## 2016-05-23 DIAGNOSIS — R0609 Other forms of dyspnea: Secondary | ICD-10-CM | POA: Diagnosis not present

## 2016-05-23 DIAGNOSIS — E119 Type 2 diabetes mellitus without complications: Secondary | ICD-10-CM | POA: Diagnosis not present

## 2016-05-23 DIAGNOSIS — E7800 Pure hypercholesterolemia, unspecified: Secondary | ICD-10-CM | POA: Diagnosis not present

## 2016-05-29 DIAGNOSIS — Z961 Presence of intraocular lens: Secondary | ICD-10-CM | POA: Diagnosis not present

## 2016-05-29 DIAGNOSIS — H401133 Primary open-angle glaucoma, bilateral, severe stage: Secondary | ICD-10-CM | POA: Diagnosis not present

## 2016-06-05 ENCOUNTER — Ambulatory Visit (INDEPENDENT_AMBULATORY_CARE_PROVIDER_SITE_OTHER): Payer: Medicare Other | Admitting: Neurology

## 2016-06-05 DIAGNOSIS — R0683 Snoring: Secondary | ICD-10-CM

## 2016-06-05 DIAGNOSIS — R0602 Shortness of breath: Secondary | ICD-10-CM

## 2016-06-05 DIAGNOSIS — I10 Essential (primary) hypertension: Secondary | ICD-10-CM

## 2016-06-05 DIAGNOSIS — Z9189 Other specified personal risk factors, not elsewhere classified: Secondary | ICD-10-CM

## 2016-06-05 DIAGNOSIS — E669 Obesity, unspecified: Secondary | ICD-10-CM

## 2016-06-05 DIAGNOSIS — G4761 Periodic limb movement disorder: Secondary | ICD-10-CM | POA: Diagnosis not present

## 2016-06-06 ENCOUNTER — Other Ambulatory Visit: Payer: Self-pay

## 2016-06-06 ENCOUNTER — Ambulatory Visit (HOSPITAL_COMMUNITY): Payer: Medicare Other | Attending: Internal Medicine

## 2016-06-06 DIAGNOSIS — E119 Type 2 diabetes mellitus without complications: Secondary | ICD-10-CM | POA: Insufficient documentation

## 2016-06-06 DIAGNOSIS — R0602 Shortness of breath: Secondary | ICD-10-CM | POA: Diagnosis not present

## 2016-06-06 DIAGNOSIS — I119 Hypertensive heart disease without heart failure: Secondary | ICD-10-CM | POA: Diagnosis not present
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
## 2016-06-11 NOTE — Procedures (Signed)
PATIENT'S NAME:  Crystal Fowler, Crystal Fowler DOB:      1941/02/10      MR#:    [id number]     DATE OF RECORDING: 06/05/2016 REFERRING M.D.:  Jani Gravel, MD Study Performed:   Baseline Polysomnogram HISTORY: Mrs. Contee, a 76 year old African-American right-handed female, presents today for a sleep consultation referred by Dr. Maudie Mercury. He had mentioned that the patient has a small upper airway. She reported being a loud snorer. He is concerned that she may have sleep apnea. The patient has osteoarthritis, diabetes type 2, gout, obesity, hyperlipidemia, hypertension, osteopenia, she suffered a bout of pancreatitis ,also has a diagnosis of a vitamin D deficiency.  Over the last 2 weeks she has felt very fatigued and tired and is no longer restored or refreshed when waking up in the morning. She reported that she suffered a nosebleed and had to visit the emergency room. Her blood pressure was 932 mm Hg systolic that evening. Obesity, Epistaxis , Nocturia, Diabetes mellitus, Dyslipidemia, Gout, Hypertension, and Snoring.  These all are additional reason for concerned about untreated sleep apnea. The patient endorsed the Epworth Sleepiness Scale at 3/24 points.    The patient's weight 186 pounds with a height of 62 (inches), resulting in a BMI of 34.1 kg/m2.  The patient's neck circumference measured 15.8 inches.  CURRENT MEDICATIONS: Azor, Coreg, Catapres, Apresoline, Norco, Glucophage, Multivitamin, Actos, Crestor, Zocor.   PROCEDURE:  This is a multichannel digital polysomnogram utilizing the Somnostar 11.2 system.  Electrodes and sensors were applied and monitored per AASM Specifications.   EEG, EOG, Chin and Limb EMG, were sampled at 200 Hz.  ECG, Snore and Nasal Pressure, Thermal Airflow, Respiratory Effort, CPAP Flow and Pressure, Oximetry was sampled at 50 Hz. Digital video and audio were recorded.      BASELINE STUDY = Lights Out was at 20:39 and Lights On at 04:58.  Total recording time (TRT) was 499.5 minutes, with  a total sleep time (TST) of  373.5 minutes.   The patient's sleep latency was 22.5 minutes.  REM latency was 323 minutes.  The sleep efficiency was 74.8 %.     SLEEP ARCHITECTURE: WASO (Wake after sleep onset) was 107.5 minutes.  There were 33.5 minutes in Stage N1, 204.5 minutes Stage N2, 111.5 minutes Stage N3 and 24 minutes in Stage REM.  The percentage of Stage N1 was 9.%, Stage N2 was 54.8%, Stage N3 was 29.9% and Stage R (REM sleep) was 6.4%.   RESPIRATORY ANALYSIS:  There were only 3 respiratory events:  3 obstructive apneas, 0 hypopneas with 0 respiratory event related arousals (RERAs).      The total APNEA/HYPOPNEA INDEX (AHI) was 0.5/hour and the total RESPIRATORY DISTURBANCE INDEX was 0.5 /hour.  3 events occurred in REM sleep and 0 events in NREM. The REM AHI was 7.5 /hour, versus a non-REM AHI of 0. The patient spent all 373.5 minutes of total sleep time in the supine position.  OXYGEN SATURATION & C02:  The Wake baseline 02 saturation was 97%, with the lowest being 88%. Time spent below 88% saturation equaled 0 minutes.   PERIODIC LIMB MOVEMENTS:   The patient had a total of 229 Periodic Limb Movements.  The Periodic Limb Movement (PLM) index was 36.8 and the PLM Arousal index was 4.3/hour. The arousals were noted as: 44 were spontaneous, 27 were associated with PLMs, and 0 were associated with respiratory events. Audio and video analysis did not show any abnormal or unusual movements, behaviors, phonations or vocalizations.  A wedge to elevate  the head of bed  was provided. The patient  took one bathroom break. Moderate snoring was noted. EKG was in keeping with normal sinus rhythm (NSR).   IMPRESSION:  No evidence of Obstructive Sleep Apnea (OSA),and  only moderate snoring .  1. Periodic Limb Movement Disorder (PLMD), which ceased during REM sleep  2. Fragmented sleep architecture.  RECOMMENDATIONS:  1. To control PLMs it is recommended to avoid caffeine-containing beverages  and chocolate. The patient already drinks very little caffeine. She did not report  restless legs syndrome (RLS).  2. Consider dedicated sleep psychology referral if insomnia is of clinical concern.   3. A follow up appointment can be scheduled in the Sleep Clinic at [facility]. The referring provider will be notified of the results.      I certify that I have reviewed the entire raw data recording prior to the issuance of this report in accordance with the Standards of Accreditation of the American Academy of Sleep Medicine (AASM)   Larey Seat, MD   06-11-2016 Diplomat, American Board of Psychiatry and Neurology  Diplomat, American Board of [place] Director, [facility] [employer]

## 2016-06-12 ENCOUNTER — Ambulatory Visit (INDEPENDENT_AMBULATORY_CARE_PROVIDER_SITE_OTHER): Payer: Medicare Other | Admitting: Cardiology

## 2016-06-12 VITALS — BP 168/84 | HR 70 | Ht 62.0 in | Wt 186.6 lb

## 2016-06-12 DIAGNOSIS — I272 Pulmonary hypertension, unspecified: Secondary | ICD-10-CM | POA: Diagnosis not present

## 2016-06-12 DIAGNOSIS — R0602 Shortness of breath: Secondary | ICD-10-CM | POA: Diagnosis not present

## 2016-06-12 DIAGNOSIS — I10 Essential (primary) hypertension: Secondary | ICD-10-CM | POA: Diagnosis not present

## 2016-06-12 MED ORDER — HYDRALAZINE HCL 25 MG PO TABS: 37.5000 mg | ORAL_TABLET | Freq: Three times a day (TID) | ORAL | 6 refills | Status: DC

## 2016-06-12 NOTE — Patient Instructions (Signed)
Medication Instructions:  INCREASE- Hydralazine 37.5 mg(1 1/2 tablets) three times a day  Labwork: None Ordered  Testing/Procedures: None Ordered  Follow-Up: Your physician wants you to follow-up in: 6 Months. You will receive a reminder letter in the mail two months in advance. If you don't receive a letter, please call our office to schedule the follow-up appointment.   Any Other Special Instructions Will Be Listed Below (If Applicable).   If you need a refill on your cardiac medications before your next appointment, please call your pharmacy.

## 2016-06-13 ENCOUNTER — Encounter: Payer: Self-pay | Admitting: Cardiology

## 2016-06-14 ENCOUNTER — Telehealth: Payer: Self-pay

## 2016-06-14 NOTE — Telephone Encounter (Signed)
I called patient a couple of times to give sleep study results but no answer and no vm. I

## 2016-06-14 NOTE — Telephone Encounter (Signed)
-----   Message from Larey Seat, MD sent at 06/11/2016 11:15 AM EDT ----- IMPRESSION:  No evidence of Obstructive Sleep Apnea (OSA),and  only moderate snoring .  1. Periodic Limb Movement Disorder (PLMD), which ceased during REM sleep  2. Fragmented sleep architecture.  RECOMMENDATIONS:  1. To control PLMs it is recommended to avoid caffeine-containing beverages and chocolate. The patient already drinks very little caffeine. She did not report restless legs syndrome (RLS).  2. Consider dedicated sleep psychology referral if insomnia is of clinical concern.   3. A follow up appointment can be scheduled in the Sleep Clinic at [facility]. The referring provider will be notified of the results.      I certify that I have reviewed the entire raw data recording prior to the issuance of this report in accordance with the Standards of Accreditation of the [place] Academy of Sleep Medicine (AASM)   Larey Seat, MD   06-11-2016

## 2016-06-22 NOTE — Telephone Encounter (Signed)
I called patient 3 more times today. I get a message that the call cannot be completed at this time. The report has been sent to PCP.

## 2016-06-27 DIAGNOSIS — E119 Type 2 diabetes mellitus without complications: Secondary | ICD-10-CM | POA: Diagnosis not present

## 2016-06-27 DIAGNOSIS — I10 Essential (primary) hypertension: Secondary | ICD-10-CM | POA: Diagnosis not present

## 2016-06-27 DIAGNOSIS — E7800 Pure hypercholesterolemia, unspecified: Secondary | ICD-10-CM | POA: Diagnosis not present

## 2016-06-28 ENCOUNTER — Other Ambulatory Visit: Payer: Self-pay | Admitting: Internal Medicine

## 2016-06-28 DIAGNOSIS — Z1231 Encounter for screening mammogram for malignant neoplasm of breast: Secondary | ICD-10-CM

## 2016-07-06 ENCOUNTER — Encounter (HOSPITAL_COMMUNITY): Payer: Self-pay | Admitting: Emergency Medicine

## 2016-07-06 ENCOUNTER — Emergency Department (HOSPITAL_COMMUNITY): Payer: Medicare Other

## 2016-07-06 ENCOUNTER — Emergency Department (HOSPITAL_COMMUNITY)
Admission: EM | Admit: 2016-07-06 | Discharge: 2016-07-06 | Disposition: A | Discharge status: Home / Self Care | Payer: Medicare Other | Attending: Emergency Medicine | Admitting: Emergency Medicine

## 2016-07-06 DIAGNOSIS — I10 Essential (primary) hypertension: Secondary | ICD-10-CM | POA: Diagnosis not present

## 2016-07-06 DIAGNOSIS — Z79899 Other long term (current) drug therapy: Secondary | ICD-10-CM | POA: Insufficient documentation

## 2016-07-06 DIAGNOSIS — R1032 Left lower quadrant pain: Secondary | ICD-10-CM | POA: Diagnosis present

## 2016-07-06 DIAGNOSIS — N132 Hydronephrosis with renal and ureteral calculous obstruction: Secondary | ICD-10-CM | POA: Diagnosis not present

## 2016-07-06 DIAGNOSIS — N201 Calculus of ureter: Secondary | ICD-10-CM

## 2016-07-06 DIAGNOSIS — E119 Type 2 diabetes mellitus without complications: Secondary | ICD-10-CM | POA: Insufficient documentation

## 2016-07-06 LAB — CBC
HCT: 33.1 % — ABNORMAL LOW (ref 36.0–46.0)
Hemoglobin: 11 g/dL — ABNORMAL LOW (ref 12.0–15.0)
MCH: 29.8 pg (ref 26.0–34.0)
MCHC: 33.2 g/dL (ref 30.0–36.0)
MCV: 89.7 fL (ref 78.0–100.0)
Platelets: 218 10*3/uL (ref 150–400)
RBC: 3.69 MIL/uL — ABNORMAL LOW (ref 3.87–5.11)
RDW: 15.2 % (ref 11.5–15.5)
WBC: 6.7 10*3/uL (ref 4.0–10.5)

## 2016-07-06 LAB — URINALYSIS, ROUTINE W REFLEX MICROSCOPIC
BILIRUBIN URINE: NEGATIVE
Glucose, UA: NEGATIVE mg/dL
KETONES UR: NEGATIVE mg/dL
NITRITE: NEGATIVE
PROTEIN: NEGATIVE mg/dL
Specific Gravity, Urine: 1.016 (ref 1.005–1.030)
pH: 7 (ref 5.0–8.0)

## 2016-07-06 LAB — LIPASE, BLOOD: LIPASE: 25 U/L (ref 11–51)

## 2016-07-06 LAB — COMPREHENSIVE METABOLIC PANEL
ALK PHOS: 45 U/L (ref 38–126)
ALT: 8 U/L — AB (ref 14–54)
AST: 17 U/L (ref 15–41)
Albumin: 4.3 g/dL (ref 3.5–5.0)
Anion gap: 8 (ref 5–15)
BILIRUBIN TOTAL: 0.9 mg/dL (ref 0.3–1.2)
BUN: 18 mg/dL (ref 6–20)
CALCIUM: 9.6 mg/dL (ref 8.9–10.3)
CHLORIDE: 105 mmol/L (ref 101–111)
CO2: 25 mmol/L (ref 22–32)
Creatinine, Ser: 1.18 mg/dL — ABNORMAL HIGH (ref 0.44–1.00)
GFR calc Af Amer: 52 mL/min — ABNORMAL LOW (ref >60)
GFR, EST NON AFRICAN AMERICAN: 45 mL/min — AB (ref >60)
Glucose, Bld: 119 mg/dL — ABNORMAL HIGH (ref 65–99)
Potassium: 4.1 mmol/L (ref 3.5–5.1)
Sodium: 138 mmol/L (ref 135–145)
Total Protein: 7.1 g/dL (ref 6.5–8.1)

## 2016-07-06 MED ORDER — OXYCODONE-ACETAMINOPHEN 5-325 MG PO TABS
1.0000 | ORAL_TABLET | Freq: Once | ORAL | Status: AC
  Administered 2016-07-06: 1 via ORAL
  Filled 2016-07-06: qty 1

## 2016-07-06 MED ORDER — HYDROCODONE-ACETAMINOPHEN 5-325 MG PO TABS: 1.0000 | ORAL_TABLET | Freq: Four times a day (QID) | ORAL | 0 refills | Status: DC | PRN

## 2016-07-06 MED ORDER — FENTANYL CITRATE (PF) 100 MCG/2ML IJ SOLN
50.0000 ug | Freq: Once | INTRAMUSCULAR | Status: AC
  Administered 2016-07-06: 50 ug via INTRAVENOUS
  Filled 2016-07-06: qty 2

## 2016-07-06 MED ORDER — TAMSULOSIN HCL 0.4 MG PO CAPS
0.4000 mg | ORAL_CAPSULE | Freq: Once | ORAL | Status: AC
  Administered 2016-07-06: 0.4 mg via ORAL
  Filled 2016-07-06: qty 1

## 2016-07-06 MED ORDER — ONDANSETRON HCL 4 MG/2ML IJ SOLN
4.0000 mg | Freq: Once | INTRAMUSCULAR | Status: AC
  Administered 2016-07-06: 4 mg via INTRAVENOUS
  Filled 2016-07-06: qty 2

## 2016-07-06 MED ORDER — TAMSULOSIN HCL 0.4 MG PO CAPS: 0.4000 mg | ORAL_CAPSULE | Freq: Every day | ORAL | 0 refills | Status: DC

## 2016-07-06 MED ORDER — ONDANSETRON 4 MG PO TBDP: ORAL_TABLET | ORAL | 0 refills | Status: DC

## 2016-07-06 MED ORDER — KETOROLAC TROMETHAMINE 30 MG/ML IJ SOLN
15.0000 mg | Freq: Once | INTRAMUSCULAR | Status: AC
  Administered 2016-07-06: 15 mg via INTRAVENOUS
  Filled 2016-07-06: qty 1

## 2016-07-06 MED ORDER — SODIUM CHLORIDE 0.9 % IV BOLUS (SEPSIS)
1000.0000 mL | Freq: Once | INTRAVENOUS | Status: AC
  Administered 2016-07-06: 1000 mL via INTRAVENOUS

## 2016-07-06 NOTE — ED Provider Notes (Signed)
[facility] DEPT Provider Note   CSN: [id number] Arrival date & time: 07/06/16  1653     History   Chief Complaint No chief complaint on file.   HPI Crystal Fowler is a 76 y.o. female.   Abdominal Pain   This is a new problem. The current episode started yesterday. The problem occurs constantly. The problem has been gradually worsening. The pain is located in the LLQ. The quality of the pain is aching and sharp. The pain is moderate. Associated symptoms include nausea, vomiting, frequency and hematuria. Nothing aggravates the symptoms. Nothing relieves the symptoms. Past workup does not include GI consult. Her past medical history does not include PUD.    Past Medical History:  Diagnosis Date  . Diabetes mellitus    type 2  . Dyslipidemia   . Gout   . Hypertension    Refractory / Severe    Patient Active Problem List   Diagnosis Date Noted  . SOB (shortness of breath) 05/21/2016  . Pulmonary HTN ([place]) 05/21/2016  . HYPERCHOLESTEROLEMIA, PURE 10/17/2006  . DEGENERATIVE JOINT DISEASE, MODERATE 10/17/2006  . DIABETES MELLITUS, TYPE II 10/16/2006  . GOUT 10/16/2006  . HYPERTENSION 10/16/2006    Past Surgical History:  Procedure Laterality Date  . ABDOMINAL HYSTERECTOMY     partial  . CATARACT EXTRACTION      OB History    No data available       Home Medications    Prior to Admission medications   Medication Sig Start Date End Date Taking? Authorizing Provider  acetaminophen (TYLENOL) 500 MG tablet Take 1,000 mg by mouth every 6 (six) hours as needed for mild pain or moderate pain.   Yes [provider]  amLODipine-olmesartan (AZOR) 10-40 MG per tablet Take 1 tablet by mouth daily.   Yes [provider]  carvedilol (COREG) 25 MG tablet Take 25 mg by mouth 2 (two) times daily with a meal.   Yes [provider]  Cholecalciferol (VITAMIN D) 2000 units tablet Take 2,000 Units by mouth daily.   Yes [provider]  cloNIDine  (CATAPRES) 0.3 MG tablet Take 0.3 mg by mouth 3 (three) times daily.   Yes [provider]  dorzolamide-timolol (COSOPT) 22.3-6.8 MG/ML ophthalmic solution Place 1 drop into both eyes 2 (two) times daily. 05/23/16  Yes [provider]  hydrALAZINE (APRESOLINE) 25 MG tablet Take 1.5 tablets (37.5 mg total) by mouth 3 (three) times daily. 06/12/16  Yes Hochrein, Jeneen Rinks, MD  latanoprost (XALATAN) 0.005 % ophthalmic solution Place 1 drop into both eyes at bedtime. 05/29/16  Yes [provider]  metFORMIN (GLUCOPHAGE) 1000 MG tablet Take 1,000 mg by mouth daily with breakfast.    Yes [provider]  pioglitazone (ACTOS) 45 MG tablet Take 45 mg by mouth daily.   Yes [provider]  rosuvastatin (CRESTOR) 10 MG tablet Take 10 mg by mouth once a week.    Yes [provider]  simvastatin (ZOCOR) 10 MG tablet TK 1 T PO QD IN THE EVE 05/14/16  Yes [provider]  HYDROcodone-acetaminophen (NORCO/VICODIN) 5-325 MG tablet Take 1 tablet by mouth every 6 (six) hours as needed for severe pain. 07/06/16   Rydell Wiegel, Corene Cornea, MD  ondansetron (ZOFRAN ODT) 4 MG disintegrating tablet 4mg  ODT q4 hours prn nausea/vomit 07/06/16   Felissa Blouch, Corene Cornea, MD  tamsulosin (FLOMAX) 0.4 MG CAPS capsule Take 1 capsule (0.4 mg total) by mouth daily. 07/06/16   Gwendloyn Forsee, Corene Cornea, MD    Family History  Family History  Problem Relation Age of Onset  . Heart attack Father 37  . Cancer Mother 75       Urinary Tract    Social History Social History  Substance Use Topics  . Smoking status: Never Smoker  . Smokeless tobacco: Never Used  . Alcohol use No     Allergies   Patient has no known allergies.   Review of Systems Review of Systems  Gastrointestinal: Positive for abdominal pain, nausea and vomiting.  Genitourinary: Positive for flank pain, frequency, hematuria, pelvic pain and urgency.  All other systems reviewed and are negative.    Physical Exam Updated Vital  Signs BP 140/70   Pulse 64   Temp 98 F (36.7 C) (Oral)   Resp 16   Ht 5\' 2"  (1.575 m)   Wt 184 lb (83.5 kg)   SpO2 96%   BMI 33.65 kg/m   Physical Exam  Constitutional: She is oriented to person, place, and time. She appears well-developed and well-nourished.  HENT:  Head: Normocephalic and atraumatic.  Eyes: Conjunctivae and EOM are normal.  Neck: Normal range of motion.  Cardiovascular: Normal rate and regular rhythm.   Pulmonary/Chest: Effort normal and breath sounds normal. No stridor. No respiratory distress.  Abdominal: Soft. She exhibits no distension. There is tenderness (mild suprapubic).  Musculoskeletal: She exhibits no edema.  Neurological: She is alert and oriented to person, place, and time. No cranial nerve deficit. Coordination normal.  Skin: Skin is warm and dry. No rash noted. No erythema. No pallor.  Nursing note and vitals reviewed.    ED Treatments / Results  Labs (all labs ordered are listed, but only abnormal results are displayed) Labs Reviewed  COMPREHENSIVE METABOLIC PANEL - Abnormal; Notable for the following:       Result Value   Glucose, Bld 119 (*)    Creatinine, Ser 1.18 (*)    ALT 8 (*)    GFR calc non Af Amer 45 (*)    GFR calc Af Amer 52 (*)    All other components within normal limits  CBC - Abnormal; Notable for the following:    RBC 3.69 (*)    Hemoglobin 11.0 (*)    HCT 33.1 (*)    All other components within normal limits  URINALYSIS, ROUTINE W REFLEX MICROSCOPIC - Abnormal; Notable for the following:    Color, Urine YELLOW (*)    APPearance CLOUDY (*)    Hgb urine dipstick MODERATE (*)    Leukocytes, UA TRACE (*)    Bacteria, UA RARE (*)    Squamous Epithelial / LPF 0-5 (*)    All other components within normal limits  URINE CULTURE  LIPASE, BLOOD    EKG  EKG Interpretation None       Radiology Ct Renal Stone Study  Result Date: 07/06/2016 CLINICAL DATA:  Acute onset of left-sided abdominal and groin pain.  Nausea. Initial encounter. EXAM: CT ABDOMEN AND PELVIS WITHOUT CONTRAST TECHNIQUE: Multidetector CT imaging of the abdomen and pelvis was performed following the standard protocol without IV contrast. COMPARISON:  CT of the abdomen and pelvis performed 11/17/2012 FINDINGS: Lower chest: Minimal right basilar atelectasis is noted. The heart is borderline normal in size. Hepatobiliary: The liver is unremarkable in appearance. The gallbladder is unremarkable in appearance. The common bile duct remains normal in caliber. Pancreas: The pancreas is within normal limits. Spleen: The spleen is unremarkable in appearance. Adrenals/Urinary Tract: The adrenal glands are unremarkable in appearance. There is mild left-sided  hydronephrosis, with prominence of the left ureter along its entire course, to the level of an obstructing 5 x 5 mm stone distally at the left vesicoureteral junction. Left-sided perinephric stranding is noted. The right kidney is unremarkable in appearance. No nonobstructing renal stones are identified. Stomach/Bowel: The stomach is unremarkable in appearance. The small bowel is within normal limits. The appendix is normal in caliber, without evidence of appendicitis. The colon is unremarkable in appearance. Vascular/Lymphatic: Scattered calcification is seen along the abdominal aorta and its branches. The abdominal aorta is otherwise grossly unremarkable. The inferior vena cava is grossly unremarkable. No retroperitoneal lymphadenopathy is seen. No pelvic sidewall lymphadenopathy is identified. Reproductive: The bladder is decompressed and not well seen. The patient is status post hysterectomy. No suspicious adnexal masses are seen. The ovaries are unremarkable in appearance. Other: No additional soft tissue abnormalities are seen. Musculoskeletal: No acute osseous abnormalities are identified. There is minimal grade 1 anterolisthesis of L3 on L4 and of L4 on L5, reflecting underlying facet disease. The  visualized musculature is unremarkable in appearance. IMPRESSION: 1. Mild left-sided hydronephrosis, with an obstructing 5 x 5 mm stone noted distally at the left vesicoureteral junction. 2. Scattered aortic atherosclerosis. 3. Minimal degenerative change along the lower lumbar spine. Electronically Signed   By: Garald Balding M.D.   On: 07/06/2016 18:54    Procedures Procedures (including critical care time)  Medications Ordered in ED Medications  sodium chloride 0.9 % bolus 1,000 mL (0 mLs Intravenous Stopped 07/06/16 2055)  fentaNYL (SUBLIMAZE) injection 50 mcg (50 mcg Intravenous Given 07/06/16 1840)  ondansetron (ZOFRAN) injection 4 mg (4 mg Intravenous Given 07/06/16 1840)  ketorolac (TORADOL) 30 MG/ML injection 15 mg (15 mg Intravenous Given 07/06/16 2051)  tamsulosin (FLOMAX) capsule 0.4 mg (0.4 mg Oral Given 07/06/16 2051)  oxyCODONE-acetaminophen (PERCOCET/ROXICET) 5-325 MG per tablet 1 tablet (1 tablet Oral Given 07/06/16 2051)     Initial Impression / Assessment and Plan / ED Course  I have reviewed the triage vital signs and the nursing notes.  Pertinent labs & imaging results that were available during my care of the patient were reviewed by me and considered in my medical decision making (see chart for details).     Kidney stone vs UTI. Will ct/pain meds/nausea meds/fluids.   Improved symptoms.   Final Clinical Impressions(s) / ED Diagnoses   Final diagnoses:  Ureterolithiasis    New Prescriptions Discharge Medication List as of 07/06/2016  8:25 PM    START taking these medications   Details  ondansetron (ZOFRAN ODT) 4 MG disintegrating tablet 4mg  ODT q4 hours prn nausea/vomit, Print    tamsulosin (FLOMAX) 0.4 MG CAPS capsule Take 1 capsule (0.4 mg total) by mouth daily., Starting Thu 07/06/2016, Print         Feiga Nadel, Corene Cornea, MD 07/06/16 [phone]

## 2016-07-06 NOTE — ED Triage Notes (Signed)
Pt complains of pain in the left abdominal/groin area that started last night.  Took tylenol last night but pain has worsened today. Endorses nausea without vomiting. Denies diarrhea. Pt ambulatory and A&O x4.

## 2016-07-08 LAB — URINE CULTURE

## 2016-07-11 DIAGNOSIS — N201 Calculus of ureter: Secondary | ICD-10-CM | POA: Diagnosis not present

## 2016-07-20 ENCOUNTER — Ambulatory Visit: Payer: Medicare Other

## 2016-07-31 ENCOUNTER — Ambulatory Visit: Payer: Medicare Other

## 2016-10-02 DIAGNOSIS — H401133 Primary open-angle glaucoma, bilateral, severe stage: Secondary | ICD-10-CM | POA: Diagnosis not present

## 2016-10-02 DIAGNOSIS — Z961 Presence of intraocular lens: Secondary | ICD-10-CM | POA: Diagnosis not present

## 2016-10-31 DIAGNOSIS — E7800 Pure hypercholesterolemia, unspecified: Secondary | ICD-10-CM | POA: Diagnosis not present

## 2016-10-31 DIAGNOSIS — E119 Type 2 diabetes mellitus without complications: Secondary | ICD-10-CM | POA: Diagnosis not present

## 2016-11-07 DIAGNOSIS — M25512 Pain in left shoulder: Secondary | ICD-10-CM | POA: Diagnosis not present

## 2016-11-08 DIAGNOSIS — M7582 Other shoulder lesions, left shoulder: Secondary | ICD-10-CM | POA: Diagnosis not present

## 2016-11-08 DIAGNOSIS — M25512 Pain in left shoulder: Secondary | ICD-10-CM | POA: Diagnosis not present

## 2016-11-08 DIAGNOSIS — M19012 Primary osteoarthritis, left shoulder: Secondary | ICD-10-CM | POA: Diagnosis not present

## 2016-11-09 ENCOUNTER — Ambulatory Visit
Admission: RE | Admit: 2016-11-09 | Discharge: 2016-11-09 | Disposition: A | Discharge status: Home / Self Care | Payer: Medicare Other | Source: Ambulatory Visit | Attending: Internal Medicine | Admitting: Internal Medicine

## 2016-11-09 DIAGNOSIS — Z1231 Encounter for screening mammogram for malignant neoplasm of breast: Secondary | ICD-10-CM | POA: Diagnosis not present

## 2016-11-13 ENCOUNTER — Other Ambulatory Visit: Payer: Self-pay | Admitting: Internal Medicine

## 2016-11-13 DIAGNOSIS — R928 Other abnormal and inconclusive findings on diagnostic imaging of breast: Secondary | ICD-10-CM

## 2016-11-15 ENCOUNTER — Ambulatory Visit
Admission: RE | Admit: 2016-11-15 | Discharge: 2016-11-15 | Disposition: A | Discharge status: Home / Self Care | Payer: Medicare Other | Source: Ambulatory Visit | Attending: Internal Medicine | Admitting: Internal Medicine

## 2016-11-15 ENCOUNTER — Other Ambulatory Visit: Payer: Self-pay | Admitting: Internal Medicine

## 2016-11-15 ENCOUNTER — Ambulatory Visit: Payer: Medicare Other

## 2016-11-15 DIAGNOSIS — N6310 Unspecified lump in the right breast, unspecified quadrant: Secondary | ICD-10-CM

## 2016-11-15 DIAGNOSIS — R928 Other abnormal and inconclusive findings on diagnostic imaging of breast: Secondary | ICD-10-CM | POA: Diagnosis not present

## 2016-12-21 DIAGNOSIS — E119 Type 2 diabetes mellitus without complications: Secondary | ICD-10-CM | POA: Diagnosis not present

## 2017-03-05 DIAGNOSIS — I10 Essential (primary) hypertension: Secondary | ICD-10-CM | POA: Diagnosis not present

## 2017-03-05 DIAGNOSIS — E119 Type 2 diabetes mellitus without complications: Secondary | ICD-10-CM | POA: Diagnosis not present

## 2017-03-07 DIAGNOSIS — Z961 Presence of intraocular lens: Secondary | ICD-10-CM | POA: Diagnosis not present

## 2017-03-07 DIAGNOSIS — H401133 Primary open-angle glaucoma, bilateral, severe stage: Secondary | ICD-10-CM | POA: Diagnosis not present

## 2017-03-09 DIAGNOSIS — I10 Essential (primary) hypertension: Secondary | ICD-10-CM | POA: Diagnosis not present

## 2017-03-09 DIAGNOSIS — E7800 Pure hypercholesterolemia, unspecified: Secondary | ICD-10-CM | POA: Diagnosis not present

## 2017-03-09 DIAGNOSIS — M109 Gout, unspecified: Secondary | ICD-10-CM | POA: Diagnosis not present

## 2017-03-09 DIAGNOSIS — E119 Type 2 diabetes mellitus without complications: Secondary | ICD-10-CM | POA: Diagnosis not present

## 2017-05-16 ENCOUNTER — Other Ambulatory Visit: Payer: Self-pay | Admitting: Cardiology

## 2017-05-17 ENCOUNTER — Ambulatory Visit
Admission: RE | Admit: 2017-05-17 | Discharge: 2017-05-17 | Disposition: A | Discharge status: Home / Self Care | Payer: Medicare Other | Source: Ambulatory Visit | Attending: Internal Medicine | Admitting: Internal Medicine

## 2017-05-17 ENCOUNTER — Other Ambulatory Visit: Payer: Self-pay | Admitting: Internal Medicine

## 2017-05-17 DIAGNOSIS — R928 Other abnormal and inconclusive findings on diagnostic imaging of breast: Secondary | ICD-10-CM | POA: Diagnosis not present

## 2017-05-17 DIAGNOSIS — N6310 Unspecified lump in the right breast, unspecified quadrant: Secondary | ICD-10-CM

## 2017-05-17 DIAGNOSIS — N6489 Other specified disorders of breast: Secondary | ICD-10-CM

## 2017-05-17 DIAGNOSIS — R921 Mammographic calcification found on diagnostic imaging of breast: Secondary | ICD-10-CM

## 2017-05-24 ENCOUNTER — Ambulatory Visit
Admission: RE | Admit: 2017-05-24 | Discharge: 2017-05-24 | Disposition: A | Discharge status: Home / Self Care | Payer: Medicare Other | Source: Ambulatory Visit | Attending: Internal Medicine | Admitting: Internal Medicine

## 2017-05-24 DIAGNOSIS — N6489 Other specified disorders of breast: Secondary | ICD-10-CM

## 2017-05-24 DIAGNOSIS — R921 Mammographic calcification found on diagnostic imaging of breast: Secondary | ICD-10-CM

## 2017-05-24 DIAGNOSIS — N6011 Diffuse cystic mastopathy of right breast: Secondary | ICD-10-CM | POA: Diagnosis not present

## 2017-05-24 DIAGNOSIS — N6312 Unspecified lump in the right breast, upper inner quadrant: Secondary | ICD-10-CM | POA: Diagnosis not present

## 2017-05-24 HISTORY — PX: BREAST BIOPSY: SHX20

## 2017-06-12 ENCOUNTER — Other Ambulatory Visit: Payer: Self-pay | Admitting: Cardiology

## 2017-07-11 DIAGNOSIS — Z961 Presence of intraocular lens: Secondary | ICD-10-CM | POA: Diagnosis not present

## 2017-07-11 DIAGNOSIS — H401133 Primary open-angle glaucoma, bilateral, severe stage: Secondary | ICD-10-CM | POA: Diagnosis not present

## 2017-07-13 ENCOUNTER — Other Ambulatory Visit: Payer: Self-pay | Admitting: Cardiology

## 2017-07-31 ENCOUNTER — Other Ambulatory Visit: Payer: Self-pay | Admitting: Cardiology

## 2017-08-01 DIAGNOSIS — Z961 Presence of intraocular lens: Secondary | ICD-10-CM | POA: Diagnosis not present

## 2017-08-01 DIAGNOSIS — H401133 Primary open-angle glaucoma, bilateral, severe stage: Secondary | ICD-10-CM | POA: Diagnosis not present

## 2017-08-14 ENCOUNTER — Other Ambulatory Visit: Payer: Self-pay | Admitting: Cardiology

## 2017-08-15 ENCOUNTER — Other Ambulatory Visit: Payer: Self-pay | Admitting: Cardiology

## 2017-08-29 DIAGNOSIS — E119 Type 2 diabetes mellitus without complications: Secondary | ICD-10-CM | POA: Diagnosis not present

## 2017-08-29 DIAGNOSIS — M109 Gout, unspecified: Secondary | ICD-10-CM | POA: Diagnosis not present

## 2017-08-29 DIAGNOSIS — Z5181 Encounter for therapeutic drug level monitoring: Secondary | ICD-10-CM | POA: Diagnosis not present

## 2017-08-29 DIAGNOSIS — Z79899 Other long term (current) drug therapy: Secondary | ICD-10-CM | POA: Diagnosis not present

## 2017-08-29 DIAGNOSIS — I10 Essential (primary) hypertension: Secondary | ICD-10-CM | POA: Diagnosis not present

## 2017-08-29 DIAGNOSIS — N390 Urinary tract infection, site not specified: Secondary | ICD-10-CM | POA: Diagnosis not present

## 2017-08-31 ENCOUNTER — Other Ambulatory Visit: Payer: Self-pay | Admitting: Internal Medicine

## 2017-08-31 DIAGNOSIS — Z1231 Encounter for screening mammogram for malignant neoplasm of breast: Secondary | ICD-10-CM

## 2017-09-25 DIAGNOSIS — Z23 Encounter for immunization: Secondary | ICD-10-CM | POA: Diagnosis not present

## 2017-09-25 DIAGNOSIS — E119 Type 2 diabetes mellitus without complications: Secondary | ICD-10-CM | POA: Diagnosis not present

## 2017-09-25 DIAGNOSIS — Z0000 Encounter for general adult medical examination without abnormal findings: Secondary | ICD-10-CM | POA: Diagnosis not present

## 2017-09-25 DIAGNOSIS — I10 Essential (primary) hypertension: Secondary | ICD-10-CM | POA: Diagnosis not present

## 2017-09-25 DIAGNOSIS — Z780 Asymptomatic menopausal state: Secondary | ICD-10-CM | POA: Diagnosis not present

## 2017-09-25 DIAGNOSIS — E7800 Pure hypercholesterolemia, unspecified: Secondary | ICD-10-CM | POA: Diagnosis not present

## 2017-10-01 DIAGNOSIS — Z961 Presence of intraocular lens: Secondary | ICD-10-CM | POA: Diagnosis not present

## 2017-10-01 DIAGNOSIS — H401133 Primary open-angle glaucoma, bilateral, severe stage: Secondary | ICD-10-CM | POA: Diagnosis not present

## 2017-11-12 ENCOUNTER — Ambulatory Visit
Admission: RE | Admit: 2017-11-12 | Discharge: 2017-11-12 | Disposition: A | Discharge status: Home / Self Care | Payer: Medicare Other | Source: Ambulatory Visit | Attending: Internal Medicine | Admitting: Internal Medicine

## 2017-11-12 DIAGNOSIS — Z1231 Encounter for screening mammogram for malignant neoplasm of breast: Secondary | ICD-10-CM

## 2017-12-25 DIAGNOSIS — Z1211 Encounter for screening for malignant neoplasm of colon: Secondary | ICD-10-CM | POA: Diagnosis not present

## 2017-12-25 DIAGNOSIS — K5904 Chronic idiopathic constipation: Secondary | ICD-10-CM | POA: Diagnosis not present

## 2017-12-25 DIAGNOSIS — Z86010 Personal history of colonic polyps: Secondary | ICD-10-CM | POA: Diagnosis not present

## 2017-12-25 DIAGNOSIS — K5730 Diverticulosis of large intestine without perforation or abscess without bleeding: Secondary | ICD-10-CM | POA: Diagnosis not present

## 2018-01-14 ENCOUNTER — Other Ambulatory Visit: Payer: Self-pay | Admitting: Cardiology

## 2018-01-21 DIAGNOSIS — Z01810 Encounter for preprocedural cardiovascular examination: Secondary | ICD-10-CM | POA: Diagnosis not present

## 2018-01-21 DIAGNOSIS — R5383 Other fatigue: Secondary | ICD-10-CM | POA: Diagnosis not present

## 2018-01-21 DIAGNOSIS — R0602 Shortness of breath: Secondary | ICD-10-CM | POA: Diagnosis not present

## 2018-01-21 DIAGNOSIS — I10 Essential (primary) hypertension: Secondary | ICD-10-CM | POA: Diagnosis not present

## 2018-02-01 DIAGNOSIS — Z961 Presence of intraocular lens: Secondary | ICD-10-CM | POA: Diagnosis not present

## 2018-02-01 DIAGNOSIS — H401133 Primary open-angle glaucoma, bilateral, severe stage: Secondary | ICD-10-CM | POA: Diagnosis not present

## 2018-03-06 ENCOUNTER — Encounter: Payer: Self-pay | Admitting: Adult Health

## 2018-03-06 ENCOUNTER — Ambulatory Visit (INDEPENDENT_AMBULATORY_CARE_PROVIDER_SITE_OTHER): Payer: Medicare Other | Admitting: Adult Health

## 2018-03-06 VITALS — BP 118/68 | HR 67 | Ht 62.0 in | Wt 173.6 lb

## 2018-03-06 DIAGNOSIS — R002 Palpitations: Secondary | ICD-10-CM

## 2018-03-06 DIAGNOSIS — I10 Essential (primary) hypertension: Secondary | ICD-10-CM | POA: Diagnosis not present

## 2018-03-06 DIAGNOSIS — Z79899 Other long term (current) drug therapy: Secondary | ICD-10-CM

## 2018-03-06 DIAGNOSIS — I519 Heart disease, unspecified: Secondary | ICD-10-CM | POA: Diagnosis not present

## 2018-03-06 NOTE — Progress Notes (Signed)
Cardiology Office Note   Date:  03/06/2018   ID:  ALANY BORMAN, DOB 30-Aug-1940, MRN [id number]  PCP:  Jani Gravel, MD  Cardiologist:  [facility]   Chief Complaint  Patient presents with  . Hypertension  . Palpitations     History of Present Illness: Crystal Fowler is a 78 y.o. female who presents for ongoing assessment and management of HTN, and chronic dyspnea. When last seen by our office hydralazine was increased to 37.5 mg TID as her BP was not well controlled.   She comes today at the request of her PCP, after having a colonoscopy planned. When they were planning to sedate her she was noted to have an "irregular heart rhythm" and the procedure was cancelled. She was unaware of the irregular HR.  She saw Dr. Collene Mares for the colonoscopy but I do not see documentation of rhythm.   She states that she "just feel tired." She continues to have mild shortness of breath.   Past Medical History:  Diagnosis Date  . Diabetes mellitus    type 2  . Dyslipidemia   . Gout   . Hypertension    Refractory / Severe    Past Surgical History:  Procedure Laterality Date  . ABDOMINAL HYSTERECTOMY     partial  . BREAST BIOPSY    . CATARACT EXTRACTION       Current Outpatient Medications  Medication Sig Dispense Refill  . amLODipine-olmesartan (AZOR) 10-40 MG per tablet Take 1 tablet by mouth daily.    . Calcium Carb-Cholecalciferol (CALCIUM 600 + D PO) Take by mouth.    . carvedilol (COREG) 25 MG tablet Take 25 mg by mouth 2 (two) times daily with a meal.    . Cholecalciferol (VITAMIN D) 2000 units tablet Take 2,000 Units by mouth daily.    . cloNIDine (CATAPRES) 0.3 MG tablet Take 0.3 mg by mouth 3 (three) times daily.    . colchicine 0.6 MG tablet Take 0.6 mg by mouth daily.    . dorzolamide-timolol (COSOPT) 22.3-6.8 MG/ML ophthalmic solution Place 1 drop into both eyes 2 (two) times daily.    . hydrALAZINE (APRESOLINE) 25 MG tablet TAKE 1 AND 1/2 TABLETS(37.5 MG) BY MOUTH DAILY 135 tablet 1  .  HYDROcodone-acetaminophen (NORCO/VICODIN) 5-325 MG tablet Take 1 tablet by mouth every 6 (six) hours as needed for severe pain. 20 tablet 0  . latanoprost (XALATAN) 0.005 % ophthalmic solution Place 1 drop into both eyes at bedtime.    . metFORMIN (GLUCOPHAGE) 1000 MG tablet Take 1,000 mg by mouth daily with breakfast.     . pioglitazone (ACTOS) 45 MG tablet Take 45 mg by mouth daily.    . simvastatin (ZOCOR) 10 MG tablet TK 1 T PO QD IN THE EVE  11   No current facility-administered medications for this visit.     Allergies:   Patient has no known allergies.    Social History:  The patient  reports that she has never smoked. She has never used smokeless tobacco. She reports that she does not drink alcohol or use drugs.   Family History:  The patient's family history includes Cancer (age of onset: 71) in her mother; Heart attack (age of onset: 59) in her father.    ROS: All other systems are reviewed and negative. Unless otherwise mentioned in H&P    PHYSICAL EXAM: VS:  BP 118/68   Pulse 67   Ht 5\' 2"  (1.575 m)   Wt 173 lb 9.6 oz (78.7  kg)   BMI 31.75 kg/m  , BMI Body mass index is 31.75 kg/m. GEN: Well nourished, well developed, in no acute distress HEENT: normal Neck: no JVD, carotid bruits, or masses Cardiac: RRR; occasional extra systole,  1/6 systolic  Murmur, at both the right and left sternal boarder, rubs, or gallops,no edema  Respiratory:  Clear to auscultation bilaterally, normal work of breathing GI: soft, nontender, nondistended, + BS MS: no deformity or atrophy Skin: warm and dry, no rash Neuro:  Strength and sensation are intact Psych: euthymic mood, full affect   EKG:  NSR rate of 67 bpm.   Recent Labs: No results found for requested labs within last 8760 hours.    Lipid Panel No results found for: CHOL, TRIG, HDL, CHOLHDL, VLDL, LDLCALC, LDLDIRECT    Wt Readings from Last 3 Encounters:  03/06/18 173 lb 9.6 oz (78.7 kg)  07/06/16 184 lb (83.5 kg)    06/12/16 186 lb 9.6 oz (84.6 kg)      Other studies Reviewed: Echo 06/06/2016 Left ventricle: The cavity size was normal. Wall thickness was   increased in a pattern of moderate LVH. Systolic function was   normal. The estimated ejection fraction was in the range of 60%  to 65%. Abnormal inferior and inferolateral strain - GLPSS at  -17%. Wall motion was normal; there were no regional wall motion abnormalities. Doppler parameters are consistent with abnormal left ventricular relaxation (grade 1 diastolic dysfunction). The  E/e&' ratio is between 8-15, suggesting indeterminate LV filling pressure. - Mitral valve: Mildly thickened leaflets . There was trivial   regurgitation. - Left atrium: The atrium was normal in size. - Right atrium: The atrium was mildly dilated. - Inferior vena cava: The vessel was normal in size. The   respirophasic diameter changes were in the normal range (>= 50%),   consistent with normal central venous pressure.   ASSESSMENT AND PLAN:  1. Irregular HR: Found incidentally prior to colonoscopy. No documentation of rhythm.  I will have a 2 week cardiac monitor placed to evaluate frequency and morphology of rhythm. No changes in her medication as this time.   2. Hypertension: Continue regimen as above. Increased dose of hydralazine when seen 18 months ago appears to be keeping BP well controlled currently. Check BMET, Mg, and CBC. Consider ischemic work up with multiple CVRF of age, HTN, HL, DM.    3. Aortic and Mitral Murmur: Will have echo completed to evaluate further especially in the setting of hypertension.   Current medicines are reviewed at length with the patient today.    Labs/ tests ordered today include: Echo, BMET, Mg, and  CBC. ]  Phill Myron. West Pugh, ANP, AACC   03/06/2018 4:03 PM    [facility] [place] [address] Office [phone] Fax [phone]

## 2018-03-06 NOTE — Patient Instructions (Signed)
Medication Instructions:  NO CHANGES- Your physician recommends that you continue on your current medications as directed. Please refer to the Current Medication list given to you today. If you need a refill on your cardiac medications before your next appointment, please call your pharmacy.  Labwork: bmet and mag today HERE IN OUR OFFICE AT [facility]    Take the provided lab slips with you to the lab for your blood draw.  When you have your labs (blood work) drawn today and your tests are completely normal, you will receive your results only by MyChart Message (if you have MyChart) -OR-  A paper copy in the mail.  If you have any lab test that is abnormal or we need to change your treatment, we will call you to review these results.  Testing/Procedures: Your physician has recommended that you wear an event monitor-14 day. Event monitors are medical devices that record the heart's electrical activity. Doctors most often [place] these monitors to diagnose arrhythmias. Arrhythmias are problems with the speed or rhythm of the heartbeat. The monitor is a small, portable device. You can wear one while you do your normal daily activities. This is usually used to diagnose what is causing palpitations/syncope (passing out). This will be performed at our [facility] location - [address].  Echocardiogram - Your physician has requested that you have an echocardiogram. Echocardiography is a painless test that uses sound waves to create images of your heart. It provides your doctor with information about the size and shape of your heart and how well your heart's chambers and valves are working. This procedure takes approximately one hour. There are no restrictions for this procedure. This will be performed at our [facility] location - [address].  Follow-Up: You will need a follow up appointment in 1 months.  Please call our office 2 months in advance to schedule this appointment.  You may  see Minus Breeding, MD or one of the following Advanced Practice Providers on your designated Care Team:   Jory Sims, DNP, AACC   Rosaria Ferries, PA-C  At [facility], you and your health needs are our priority.  As part of our continuing mission to provide you with exceptional heart care, we have created designated Provider Care Teams.  These Care Teams include your primary Cardiologist (physician) and Advanced Practice Providers (APPs -  Physician Assistants and Nurse Practitioners) who all work together to provide you with the care you need, when you need it.  Thank you for choosing [facility] at [facility]!!

## 2018-03-07 LAB — BASIC METABOLIC PANEL
BUN/Creatinine Ratio: 12 (ref 12–28)
BUN: 15 mg/dL (ref 8–27)
CO2: 23 mmol/L (ref 20–29)
Calcium: 10 mg/dL (ref 8.7–10.3)
Chloride: 105 mmol/L (ref 96–106)
Creatinine, Ser: 1.23 mg/dL — ABNORMAL HIGH (ref 0.57–1.00)
GFR calc Af Amer: 50 mL/min/{1.73_m2} — ABNORMAL LOW (ref >59)
GFR, EST NON AFRICAN AMERICAN: 43 mL/min/{1.73_m2} — AB (ref >59)
Glucose: 94 mg/dL (ref 65–99)
Potassium: 4.4 mmol/L (ref 3.5–5.2)
Sodium: 144 mmol/L (ref 134–144)

## 2018-03-07 LAB — MAGNESIUM: Magnesium: 2 mg/dL (ref 1.6–2.3)

## 2018-03-08 NOTE — Addendum Note (Signed)
Addended by: Crissie Reese on: 03/08/2018 10:06 AM   Modules accepted: Orders

## 2018-03-18 ENCOUNTER — Other Ambulatory Visit: Payer: Self-pay

## 2018-03-18 ENCOUNTER — Other Ambulatory Visit: Payer: Self-pay | Admitting: Adult Health

## 2018-03-18 ENCOUNTER — Ambulatory Visit (INDEPENDENT_AMBULATORY_CARE_PROVIDER_SITE_OTHER): Payer: Medicare Other

## 2018-03-18 ENCOUNTER — Ambulatory Visit (HOSPITAL_COMMUNITY): Payer: Medicare Other | Attending: Cardiology

## 2018-03-18 DIAGNOSIS — I499 Cardiac arrhythmia, unspecified: Secondary | ICD-10-CM

## 2018-03-18 DIAGNOSIS — R0600 Dyspnea, unspecified: Secondary | ICD-10-CM | POA: Diagnosis not present

## 2018-03-18 DIAGNOSIS — R5383 Other fatigue: Secondary | ICD-10-CM

## 2018-03-18 DIAGNOSIS — I10 Essential (primary) hypertension: Secondary | ICD-10-CM | POA: Insufficient documentation

## 2018-03-18 DIAGNOSIS — I519 Heart disease, unspecified: Secondary | ICD-10-CM | POA: Insufficient documentation

## 2018-03-21 ENCOUNTER — Encounter: Payer: Self-pay | Admitting: Cardiology

## 2018-03-21 DIAGNOSIS — Z79899 Other long term (current) drug therapy: Secondary | ICD-10-CM | POA: Diagnosis not present

## 2018-03-21 DIAGNOSIS — I10 Essential (primary) hypertension: Secondary | ICD-10-CM | POA: Diagnosis not present

## 2018-03-21 DIAGNOSIS — E119 Type 2 diabetes mellitus without complications: Secondary | ICD-10-CM | POA: Diagnosis not present

## 2018-03-21 DIAGNOSIS — M8589 Other specified disorders of bone density and structure, multiple sites: Secondary | ICD-10-CM | POA: Diagnosis not present

## 2018-03-21 DIAGNOSIS — M859 Disorder of bone density and structure, unspecified: Secondary | ICD-10-CM | POA: Diagnosis not present

## 2018-03-21 DIAGNOSIS — M8580 Other specified disorders of bone density and structure, unspecified site: Secondary | ICD-10-CM | POA: Diagnosis not present

## 2018-03-28 DIAGNOSIS — E119 Type 2 diabetes mellitus without complications: Secondary | ICD-10-CM | POA: Diagnosis not present

## 2018-03-28 DIAGNOSIS — I10 Essential (primary) hypertension: Secondary | ICD-10-CM | POA: Diagnosis not present

## 2018-03-28 DIAGNOSIS — Z0000 Encounter for general adult medical examination without abnormal findings: Secondary | ICD-10-CM | POA: Diagnosis not present

## 2018-03-28 DIAGNOSIS — M109 Gout, unspecified: Secondary | ICD-10-CM | POA: Diagnosis not present

## 2018-03-28 DIAGNOSIS — E7800 Pure hypercholesterolemia, unspecified: Secondary | ICD-10-CM | POA: Diagnosis not present

## 2018-04-09 NOTE — Progress Notes (Signed)
Cardiology Office Note   Date:  04/10/2018   ID:  Crystal Fowler, DOB 02-Jul-1940, MRN [id number]  PCP:  Jani Gravel, MD  Cardiologist:   Minus Breeding, MD  Referring:  Jani Gravel, MD  Chief Complaint  Patient presents with  . Shortness of Breath      History of Present Illness: Crystal Fowler is a 78 y.o. female who presents for evaluation of shortness of breath.  She was seen recently because of an irregular heart rhythm at the time of a colonoscopy.  There was no documentation of this rhythm and there was no arrhythmia noted on a two week event monitor.  Since she was seen she is done well.  She does not really feel palpitations.  She gets a little winded doing activities but denies any chest pressure, neck or arm discomfort.  She had no palpitations, presyncope or syncope.  She thinks that her dyspnea is chronic and mild.  She lives by herself and does her household chores without overt limitations.  Past Medical History:  Diagnosis Date  . Diabetes mellitus    type 2  . Dyslipidemia   . Gout   . Hypertension    Refractory / Severe    Past Surgical History:  Procedure Laterality Date  . ABDOMINAL HYSTERECTOMY     partial  . BREAST BIOPSY    . CATARACT EXTRACTION       Current Outpatient Medications  Medication Sig Dispense Refill  . amLODipine-olmesartan (AZOR) 10-40 MG per tablet Take 1 tablet by mouth daily.    . Calcium Carb-Cholecalciferol (CALCIUM 600 + D PO) Take by mouth.    . carvedilol (COREG) 25 MG tablet Take 25 mg by mouth 2 (two) times daily with a meal.    . Cholecalciferol (VITAMIN D) 2000 units tablet Take 2,000 Units by mouth daily.    . cloNIDine (CATAPRES) 0.3 MG tablet Take 0.3 mg by mouth 3 (three) times daily.    . colchicine 0.6 MG tablet Take 0.6 mg by mouth daily.    . dorzolamide-timolol (COSOPT) 22.3-6.8 MG/ML ophthalmic solution Place 1 drop into both eyes 2 (two) times daily.    . hydrALAZINE (APRESOLINE) 25 MG tablet TAKE 1 AND 1/2  TABLETS(37.5 MG) BY MOUTH DAILY 135 tablet 1  . HYDROcodone-acetaminophen (NORCO/VICODIN) 5-325 MG tablet Take 1 tablet by mouth every 6 (six) hours as needed for severe pain. 20 tablet 0  . latanoprost (XALATAN) 0.005 % ophthalmic solution Place 1 drop into both eyes at bedtime.    . metFORMIN (GLUCOPHAGE) 1000 MG tablet Take 1,000 mg by mouth daily with breakfast.     . pioglitazone (ACTOS) 45 MG tablet Take 45 mg by mouth daily.    . simvastatin (ZOCOR) 10 MG tablet Take 10 mg by mouth daily.     No current facility-administered medications for this visit.     Allergies:   Patient has no known allergies.    ROS:  Please see the history of present illness.   Otherwise, review of systems are positive none.  All other systems are reviewed and negative.    PHYSICAL EXAM: VS:  BP 110/60   Pulse 72   Ht 5\' 2"  (1.575 m)   Wt 174 lb (78.9 kg)   BMI 31.83 kg/m  , BMI Body mass index is 31.83 kg/m. GENERAL:  Well appearing NECK:  No jugular venous distention, waveform within normal limits, carotid upstroke brisk and symmetric, no bruits, no thyromegaly LUNGS:  Clear  to auscultation bilaterally CHEST:  Unremarkable HEART:  PMI not displaced or sustained,S1 and S2 within normal limits, no S3, no S4, no clicks, no rubs, no murmurs ABD:  Flat, positive bowel sounds normal in frequency in pitch, no bruits, no rebound, no guarding, no midline pulsatile mass, no hepatomegaly, no splenomegaly EXT:  2 plus pulses throughout, no edema, no cyanosis no clubbing   EKG:  EKG is not ordered today.   Recent Labs: 03/06/2018: BUN 15; Creatinine, Ser 1.23; Magnesium 2.0; Potassium 4.4; Sodium 144    Lipid Panel No results found for: CHOL, TRIG, HDL, CHOLHDL, VLDL, LDLCALC, LDLDIRECT    Wt Readings from Last 3 Encounters:  04/10/18 174 lb (78.9 kg)  03/06/18 173 lb 9.6 oz (78.7 kg)  07/06/16 184 lb (83.5 kg)      Other studies Reviewed: Additional studies/ records that were reviewed today  include:   Event monitor Review of the above records demonstrates:  See elsewhere   ASSESSMENT AND PLAN:    ARRHYTHMIA: She does have some ectopy has been listening to her today but there were no significant dysrhythmias noted and no contraindications to proceeding with colonoscopy.  No change in therapy.  DYSPNEA: She does not still describe this but it does not seem to be limiting or severe.  She had no significant abnormalities on previous echo other than possible mild diastolic dysfunction.  BNP was normal.  No change in therapy.   HTN:   The blood pressure is at target. No change in medications is indicated. We will continue with therapeutic lifestyle changes (TLC).  Current medicines are reviewed at length with the patient today.  The patient does not have concerns regarding medicines.  The following changes have been made:  None  Labs/ tests ordered today include:  None  No orders of the defined types were placed in this encounter.    Disposition:   FU with [place] as needed.   Signed, Minus Breeding, MD  04/10/2018 12:25 PM    [facility]

## 2018-04-10 ENCOUNTER — Encounter: Payer: Self-pay | Admitting: Cardiology

## 2018-04-10 ENCOUNTER — Ambulatory Visit (INDEPENDENT_AMBULATORY_CARE_PROVIDER_SITE_OTHER): Payer: Medicare Other | Admitting: Cardiology

## 2018-04-10 VITALS — BP 110/60 | HR 72 | Ht 62.0 in | Wt 174.0 lb

## 2018-04-10 DIAGNOSIS — I10 Essential (primary) hypertension: Secondary | ICD-10-CM | POA: Diagnosis not present

## 2018-04-10 DIAGNOSIS — R0600 Dyspnea, unspecified: Secondary | ICD-10-CM

## 2018-04-10 DIAGNOSIS — I499 Cardiac arrhythmia, unspecified: Secondary | ICD-10-CM | POA: Diagnosis not present

## 2018-04-10 NOTE — Patient Instructions (Signed)
Medication Instructions:  Continue current medications  If you need a refill on your cardiac medications before your next appointment, please call your pharmacy.  Labwork: None Ordered   Take the provided lab slips with you to the lab for your blood draw.   When you have your labs (blood work) drawn today and your tests are completely normal, you will receive your results only by MyChart Message (if you have MyChart) -OR-  A paper copy in the mail.  If you have any lab test that is abnormal or we need to change your treatment, we will call you to review these results.  Testing/Procedures: None Ordered   Follow-Up: . Your physician recommends that you schedule a follow-up appointment in: As Needed   At [facility], you and your health needs are our priority.  As part of our continuing mission to provide you with exceptional heart care, we have created designated Provider Care Teams.  These Care Teams include your primary Cardiologist (physician) and Advanced Practice Providers (APPs -  Physician Assistants and Nurse Practitioners) who all work together to provide you with the care you need, when you need it.  Thank you for choosing [facility] at [facility]!!

## 2018-06-05 DIAGNOSIS — H401123 Primary open-angle glaucoma, left eye, severe stage: Secondary | ICD-10-CM | POA: Diagnosis not present

## 2018-06-05 DIAGNOSIS — Z961 Presence of intraocular lens: Secondary | ICD-10-CM | POA: Diagnosis not present

## 2018-06-05 DIAGNOSIS — H401111 Primary open-angle glaucoma, right eye, mild stage: Secondary | ICD-10-CM | POA: Diagnosis not present

## 2018-09-25 DIAGNOSIS — E7800 Pure hypercholesterolemia, unspecified: Secondary | ICD-10-CM | POA: Diagnosis not present

## 2018-09-25 DIAGNOSIS — I10 Essential (primary) hypertension: Secondary | ICD-10-CM | POA: Diagnosis not present

## 2018-09-25 DIAGNOSIS — E119 Type 2 diabetes mellitus without complications: Secondary | ICD-10-CM | POA: Diagnosis not present

## 2018-09-25 DIAGNOSIS — M109 Gout, unspecified: Secondary | ICD-10-CM | POA: Diagnosis not present

## 2018-10-02 DIAGNOSIS — E119 Type 2 diabetes mellitus without complications: Secondary | ICD-10-CM | POA: Diagnosis not present

## 2018-10-02 DIAGNOSIS — Z0000 Encounter for general adult medical examination without abnormal findings: Secondary | ICD-10-CM | POA: Diagnosis not present

## 2018-10-02 DIAGNOSIS — E7800 Pure hypercholesterolemia, unspecified: Secondary | ICD-10-CM | POA: Diagnosis not present

## 2018-10-04 DIAGNOSIS — H401111 Primary open-angle glaucoma, right eye, mild stage: Secondary | ICD-10-CM | POA: Diagnosis not present

## 2018-10-04 DIAGNOSIS — Z961 Presence of intraocular lens: Secondary | ICD-10-CM | POA: Diagnosis not present

## 2018-10-04 DIAGNOSIS — H401123 Primary open-angle glaucoma, left eye, severe stage: Secondary | ICD-10-CM | POA: Diagnosis not present

## 2018-10-09 ENCOUNTER — Other Ambulatory Visit: Payer: Self-pay | Admitting: Family Medicine

## 2018-10-09 DIAGNOSIS — Z1231 Encounter for screening mammogram for malignant neoplasm of breast: Secondary | ICD-10-CM

## 2018-10-24 DIAGNOSIS — H401123 Primary open-angle glaucoma, left eye, severe stage: Secondary | ICD-10-CM | POA: Diagnosis not present

## 2018-11-25 ENCOUNTER — Ambulatory Visit
Admission: RE | Admit: 2018-11-25 | Discharge: 2018-11-25 | Disposition: A | Discharge status: Home / Self Care | Payer: Medicare Other | Source: Ambulatory Visit | Attending: Family Medicine | Admitting: Family Medicine

## 2018-11-25 ENCOUNTER — Other Ambulatory Visit: Payer: Self-pay

## 2018-11-25 DIAGNOSIS — Z1231 Encounter for screening mammogram for malignant neoplasm of breast: Secondary | ICD-10-CM | POA: Diagnosis not present

## 2018-12-13 DIAGNOSIS — E119 Type 2 diabetes mellitus without complications: Secondary | ICD-10-CM | POA: Diagnosis not present

## 2019-02-05 DIAGNOSIS — H401111 Primary open-angle glaucoma, right eye, mild stage: Secondary | ICD-10-CM | POA: Diagnosis not present

## 2019-02-05 DIAGNOSIS — Z9889 Other specified postprocedural states: Secondary | ICD-10-CM | POA: Diagnosis not present

## 2019-02-05 DIAGNOSIS — H401123 Primary open-angle glaucoma, left eye, severe stage: Secondary | ICD-10-CM | POA: Diagnosis not present

## 2019-02-05 DIAGNOSIS — Z961 Presence of intraocular lens: Secondary | ICD-10-CM | POA: Diagnosis not present

## 2019-03-24 ENCOUNTER — Ambulatory Visit: Payer: Medicaid Other | Attending: Internal Medicine

## 2019-03-24 DIAGNOSIS — Z23 Encounter for immunization: Secondary | ICD-10-CM

## 2019-03-24 NOTE — Progress Notes (Signed)
   Covid-19 Vaccination Clinic  Name:  Crystal Fowler    MRN: [id number] DOB: [birth year]  03/24/2019  Crystal Fowler was observed post Covid-19 immunization forf2 15 minutes without incidence. She was provided with Vaccine Information Sheet and instruction to access the V-Safe system.   Crystal Fowler was instructed to call 911 with any severe reactions post vaccine: Marland Kitchen Difficulty breathing  . Swelling of your face and throat  . A fast heartbeat  . A bad rash all over your body  . Dizziness and weakness    Immunizations Administered    Name Date Dose VIS Date Route   Pfizer COVID-19 Vaccine 03/24/2019  2:50 PM 0.3 mL 02/07/2019 Intramuscular   Manufacturer: [place]   Lot: [id number]   [place]: [id number]

## 2019-03-28 DIAGNOSIS — N183 Chronic kidney disease, stage 3 unspecified: Secondary | ICD-10-CM | POA: Diagnosis not present

## 2019-03-28 DIAGNOSIS — E7800 Pure hypercholesterolemia, unspecified: Secondary | ICD-10-CM | POA: Diagnosis not present

## 2019-03-28 DIAGNOSIS — M109 Gout, unspecified: Secondary | ICD-10-CM | POA: Diagnosis not present

## 2019-03-28 DIAGNOSIS — E119 Type 2 diabetes mellitus without complications: Secondary | ICD-10-CM | POA: Diagnosis not present

## 2019-04-04 DIAGNOSIS — E119 Type 2 diabetes mellitus without complications: Secondary | ICD-10-CM | POA: Diagnosis not present

## 2019-04-04 DIAGNOSIS — E7800 Pure hypercholesterolemia, unspecified: Secondary | ICD-10-CM | POA: Diagnosis not present

## 2019-04-04 DIAGNOSIS — M19012 Primary osteoarthritis, left shoulder: Secondary | ICD-10-CM | POA: Diagnosis not present

## 2019-04-04 DIAGNOSIS — M109 Gout, unspecified: Secondary | ICD-10-CM | POA: Diagnosis not present

## 2019-04-04 DIAGNOSIS — N183 Chronic kidney disease, stage 3 unspecified: Secondary | ICD-10-CM | POA: Diagnosis not present

## 2019-04-11 ENCOUNTER — Ambulatory Visit: Payer: Medicaid Other

## 2019-04-14 ENCOUNTER — Ambulatory Visit: Payer: Medicaid Other | Attending: Internal Medicine

## 2019-04-14 DIAGNOSIS — Z23 Encounter for immunization: Secondary | ICD-10-CM

## 2019-04-14 NOTE — Progress Notes (Signed)
   Covid-19 Vaccination Clinic  Name:  SOOKIE TONA    MRN: [id number] DOB: 06-18-1940  04/14/2019  Ms. Briganti was observed post Covid-19 immunization for 15 minutes without incidence. She was provided with Vaccine Information Sheet and instruction to access the V-Safe system.   Ms. Hunt was instructed to call 911 with any severe reactions post vaccine: Marland Kitchen Difficulty breathing  . Swelling of your face and throat  . A fast heartbeat  . A bad rash all over your body  . Dizziness and weakness    Immunizations Administered    Name Date Dose VIS Date Route   Pfizer COVID-19 Vaccine 04/14/2019  3:16 PM 0.3 mL 02/07/2019 Intramuscular   Manufacturer: [place]   Lot: [id number]   [place]: [id number]

## 2019-05-06 DIAGNOSIS — H401123 Primary open-angle glaucoma, left eye, severe stage: Secondary | ICD-10-CM | POA: Diagnosis not present

## 2019-05-06 DIAGNOSIS — H401112 Primary open-angle glaucoma, right eye, moderate stage: Secondary | ICD-10-CM | POA: Diagnosis not present

## 2019-05-06 DIAGNOSIS — Z9889 Other specified postprocedural states: Secondary | ICD-10-CM | POA: Diagnosis not present

## 2019-05-06 DIAGNOSIS — Z961 Presence of intraocular lens: Secondary | ICD-10-CM | POA: Diagnosis not present

## 2019-06-17 DIAGNOSIS — Z9889 Other specified postprocedural states: Secondary | ICD-10-CM | POA: Diagnosis not present

## 2019-06-17 DIAGNOSIS — H401123 Primary open-angle glaucoma, left eye, severe stage: Secondary | ICD-10-CM | POA: Diagnosis not present

## 2019-06-17 DIAGNOSIS — Z961 Presence of intraocular lens: Secondary | ICD-10-CM | POA: Diagnosis not present

## 2019-06-17 DIAGNOSIS — H401112 Primary open-angle glaucoma, right eye, moderate stage: Secondary | ICD-10-CM | POA: Diagnosis not present

## 2019-06-17 DIAGNOSIS — E119 Type 2 diabetes mellitus without complications: Secondary | ICD-10-CM | POA: Diagnosis not present

## 2019-09-17 DIAGNOSIS — Z961 Presence of intraocular lens: Secondary | ICD-10-CM | POA: Diagnosis not present

## 2019-09-17 DIAGNOSIS — E119 Type 2 diabetes mellitus without complications: Secondary | ICD-10-CM | POA: Diagnosis not present

## 2019-09-17 DIAGNOSIS — H401123 Primary open-angle glaucoma, left eye, severe stage: Secondary | ICD-10-CM | POA: Diagnosis not present

## 2019-09-17 DIAGNOSIS — H401112 Primary open-angle glaucoma, right eye, moderate stage: Secondary | ICD-10-CM | POA: Diagnosis not present

## 2019-09-17 DIAGNOSIS — Z9889 Other specified postprocedural states: Secondary | ICD-10-CM | POA: Diagnosis not present

## 2019-09-29 DIAGNOSIS — E119 Type 2 diabetes mellitus without complications: Secondary | ICD-10-CM | POA: Diagnosis not present

## 2019-09-29 DIAGNOSIS — N183 Chronic kidney disease, stage 3 unspecified: Secondary | ICD-10-CM | POA: Diagnosis not present

## 2019-09-29 DIAGNOSIS — M109 Gout, unspecified: Secondary | ICD-10-CM | POA: Diagnosis not present

## 2019-10-06 DIAGNOSIS — E1122 Type 2 diabetes mellitus with diabetic chronic kidney disease: Secondary | ICD-10-CM | POA: Diagnosis not present

## 2019-10-06 DIAGNOSIS — M109 Gout, unspecified: Secondary | ICD-10-CM | POA: Diagnosis not present

## 2019-10-06 DIAGNOSIS — Z23 Encounter for immunization: Secondary | ICD-10-CM | POA: Diagnosis not present

## 2019-10-06 DIAGNOSIS — E7800 Pure hypercholesterolemia, unspecified: Secondary | ICD-10-CM | POA: Diagnosis not present

## 2019-10-06 DIAGNOSIS — I129 Hypertensive chronic kidney disease with stage 1 through stage 4 chronic kidney disease, or unspecified chronic kidney disease: Secondary | ICD-10-CM | POA: Diagnosis not present

## 2019-10-06 DIAGNOSIS — Z0000 Encounter for general adult medical examination without abnormal findings: Secondary | ICD-10-CM | POA: Diagnosis not present

## 2019-10-24 ENCOUNTER — Other Ambulatory Visit: Payer: Self-pay | Admitting: Family Medicine

## 2019-10-24 DIAGNOSIS — Z1231 Encounter for screening mammogram for malignant neoplasm of breast: Secondary | ICD-10-CM

## 2019-11-26 ENCOUNTER — Ambulatory Visit
Admission: RE | Admit: 2019-11-26 | Discharge: 2019-11-26 | Disposition: A | Discharge status: Home / Self Care | Payer: Medicare HMO | Source: Ambulatory Visit | Attending: Family Medicine | Admitting: Family Medicine

## 2019-11-26 ENCOUNTER — Other Ambulatory Visit: Payer: Self-pay

## 2019-11-26 DIAGNOSIS — Z1231 Encounter for screening mammogram for malignant neoplasm of breast: Secondary | ICD-10-CM

## 2020-01-16 DIAGNOSIS — H401123 Primary open-angle glaucoma, left eye, severe stage: Secondary | ICD-10-CM | POA: Diagnosis not present

## 2020-01-16 DIAGNOSIS — Z961 Presence of intraocular lens: Secondary | ICD-10-CM | POA: Diagnosis not present

## 2020-01-16 DIAGNOSIS — E119 Type 2 diabetes mellitus without complications: Secondary | ICD-10-CM | POA: Diagnosis not present

## 2020-01-16 DIAGNOSIS — H401112 Primary open-angle glaucoma, right eye, moderate stage: Secondary | ICD-10-CM | POA: Diagnosis not present

## 2020-01-16 DIAGNOSIS — Z9889 Other specified postprocedural states: Secondary | ICD-10-CM | POA: Diagnosis not present

## 2020-04-01 DIAGNOSIS — I129 Hypertensive chronic kidney disease with stage 1 through stage 4 chronic kidney disease, or unspecified chronic kidney disease: Secondary | ICD-10-CM | POA: Diagnosis not present

## 2020-04-01 DIAGNOSIS — E7800 Pure hypercholesterolemia, unspecified: Secondary | ICD-10-CM | POA: Diagnosis not present

## 2020-04-01 DIAGNOSIS — E1122 Type 2 diabetes mellitus with diabetic chronic kidney disease: Secondary | ICD-10-CM | POA: Diagnosis not present

## 2020-04-08 DIAGNOSIS — I10 Essential (primary) hypertension: Secondary | ICD-10-CM | POA: Diagnosis not present

## 2020-04-08 DIAGNOSIS — E119 Type 2 diabetes mellitus without complications: Secondary | ICD-10-CM | POA: Diagnosis not present

## 2020-04-08 DIAGNOSIS — M109 Gout, unspecified: Secondary | ICD-10-CM | POA: Diagnosis not present

## 2020-04-08 DIAGNOSIS — R635 Abnormal weight gain: Secondary | ICD-10-CM | POA: Diagnosis not present

## 2020-04-08 DIAGNOSIS — R32 Unspecified urinary incontinence: Secondary | ICD-10-CM | POA: Diagnosis not present

## 2020-04-08 DIAGNOSIS — E7800 Pure hypercholesterolemia, unspecified: Secondary | ICD-10-CM | POA: Diagnosis not present

## 2020-08-27 DIAGNOSIS — E119 Type 2 diabetes mellitus without complications: Secondary | ICD-10-CM | POA: Diagnosis not present

## 2020-08-27 DIAGNOSIS — Z961 Presence of intraocular lens: Secondary | ICD-10-CM | POA: Diagnosis not present

## 2020-08-27 DIAGNOSIS — H401123 Primary open-angle glaucoma, left eye, severe stage: Secondary | ICD-10-CM | POA: Diagnosis not present

## 2020-08-27 DIAGNOSIS — Z9889 Other specified postprocedural states: Secondary | ICD-10-CM | POA: Diagnosis not present

## 2020-08-27 DIAGNOSIS — H401112 Primary open-angle glaucoma, right eye, moderate stage: Secondary | ICD-10-CM | POA: Diagnosis not present

## 2020-09-29 DIAGNOSIS — E7800 Pure hypercholesterolemia, unspecified: Secondary | ICD-10-CM | POA: Diagnosis not present

## 2020-09-29 DIAGNOSIS — I10 Essential (primary) hypertension: Secondary | ICD-10-CM | POA: Diagnosis not present

## 2020-09-29 DIAGNOSIS — M109 Gout, unspecified: Secondary | ICD-10-CM | POA: Diagnosis not present

## 2020-09-29 DIAGNOSIS — E119 Type 2 diabetes mellitus without complications: Secondary | ICD-10-CM | POA: Diagnosis not present

## 2020-09-29 DIAGNOSIS — R635 Abnormal weight gain: Secondary | ICD-10-CM | POA: Diagnosis not present

## 2020-10-06 DIAGNOSIS — I10 Essential (primary) hypertension: Secondary | ICD-10-CM | POA: Diagnosis not present

## 2020-10-06 DIAGNOSIS — E7800 Pure hypercholesterolemia, unspecified: Secondary | ICD-10-CM | POA: Diagnosis not present

## 2020-10-06 DIAGNOSIS — M109 Gout, unspecified: Secondary | ICD-10-CM | POA: Diagnosis not present

## 2020-10-06 DIAGNOSIS — E119 Type 2 diabetes mellitus without complications: Secondary | ICD-10-CM | POA: Diagnosis not present

## 2020-10-06 DIAGNOSIS — Z0000 Encounter for general adult medical examination without abnormal findings: Secondary | ICD-10-CM | POA: Diagnosis not present

## 2020-10-06 DIAGNOSIS — M542 Cervicalgia: Secondary | ICD-10-CM | POA: Diagnosis not present

## 2020-10-18 ENCOUNTER — Other Ambulatory Visit: Payer: Self-pay | Admitting: Family Medicine

## 2020-10-18 DIAGNOSIS — Z1231 Encounter for screening mammogram for malignant neoplasm of breast: Secondary | ICD-10-CM

## 2020-11-30 ENCOUNTER — Ambulatory Visit
Admission: RE | Admit: 2020-11-30 | Discharge: 2020-11-30 | Disposition: A | Discharge status: Home / Self Care | Payer: Medicare HMO | Source: Ambulatory Visit | Attending: Family Medicine | Admitting: Family Medicine

## 2020-11-30 ENCOUNTER — Other Ambulatory Visit: Payer: Self-pay

## 2020-11-30 DIAGNOSIS — Z1231 Encounter for screening mammogram for malignant neoplasm of breast: Secondary | ICD-10-CM

## 2020-12-28 DIAGNOSIS — Z961 Presence of intraocular lens: Secondary | ICD-10-CM | POA: Diagnosis not present

## 2020-12-28 DIAGNOSIS — E119 Type 2 diabetes mellitus without complications: Secondary | ICD-10-CM | POA: Diagnosis not present

## 2020-12-28 DIAGNOSIS — H401123 Primary open-angle glaucoma, left eye, severe stage: Secondary | ICD-10-CM | POA: Diagnosis not present

## 2020-12-28 DIAGNOSIS — H401112 Primary open-angle glaucoma, right eye, moderate stage: Secondary | ICD-10-CM | POA: Diagnosis not present

## 2021-01-24 IMAGING — MG MM DIGITAL SCREENING BILAT W/ TOMO W/ CAD
6 of 10 series · 6 of 30 positions shown · non-contrast
Comparison: Previous exam(s).

CLINICAL DATA: Screening.

EXAM:
DIGITAL SCREENING BILATERAL MAMMOGRAM WITH TOMO AND CAD

[R CC synth-2D]
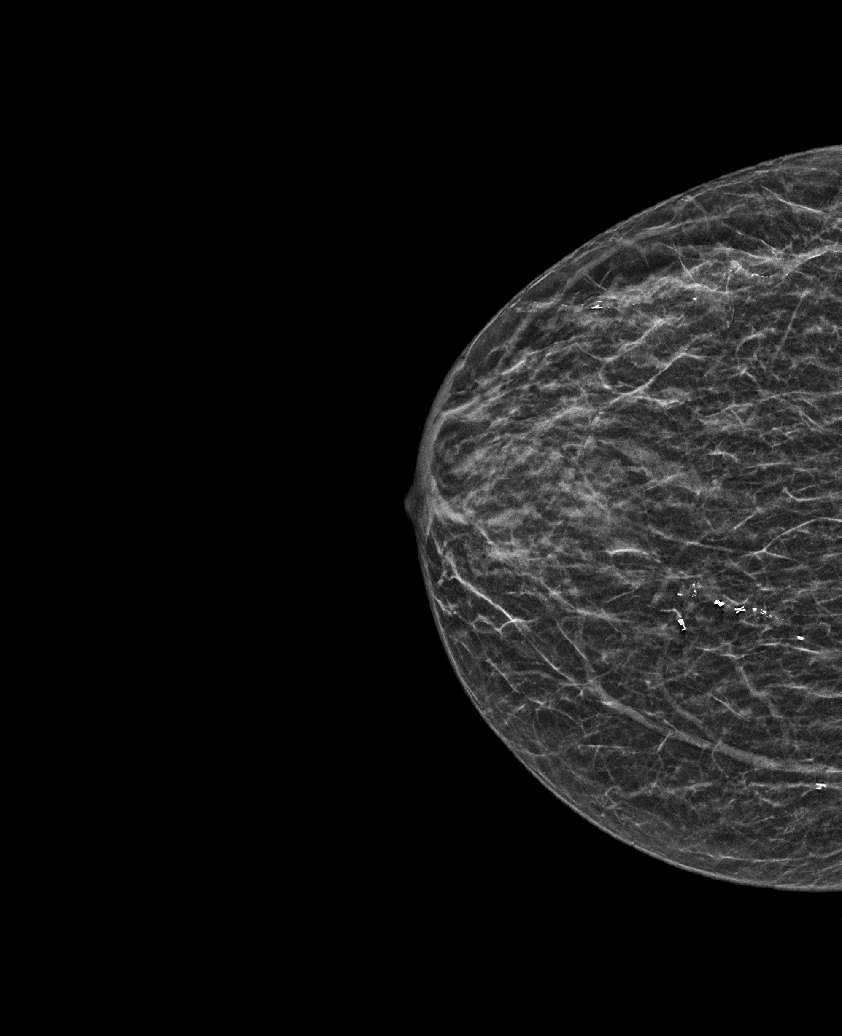

[L MLO synth-2D]
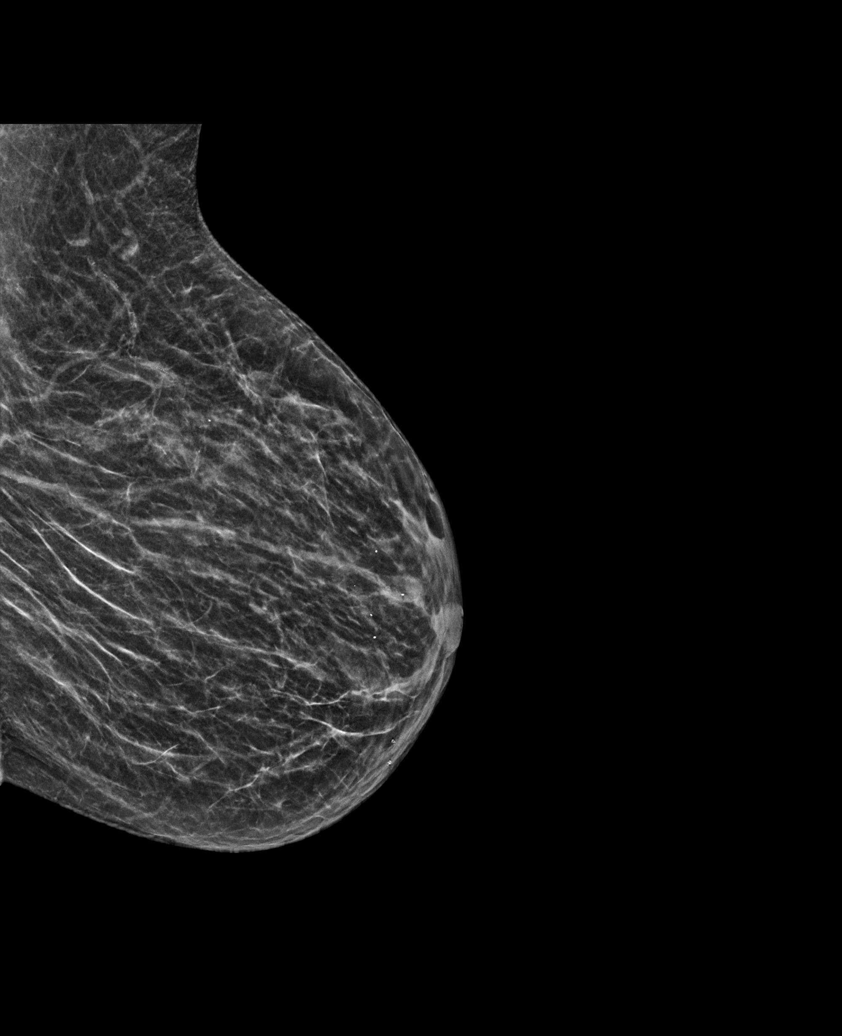

[R MLO synth-2D]
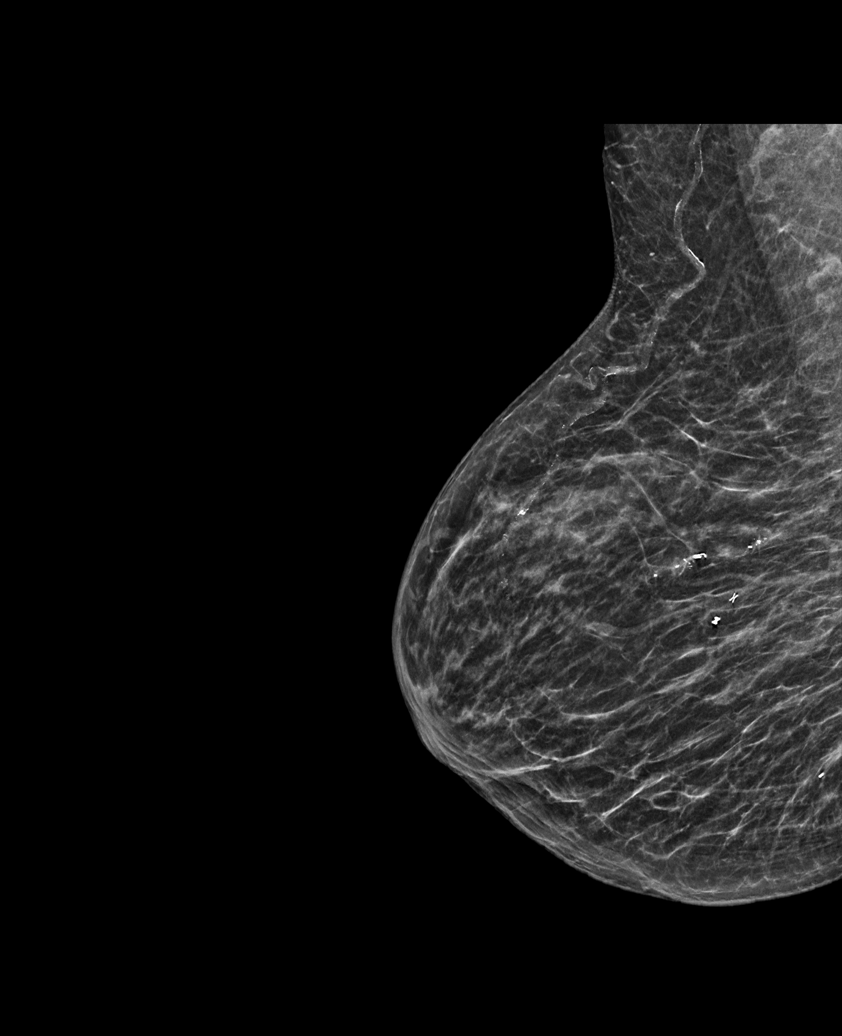

[L CC synth-2D (1 of 2)]
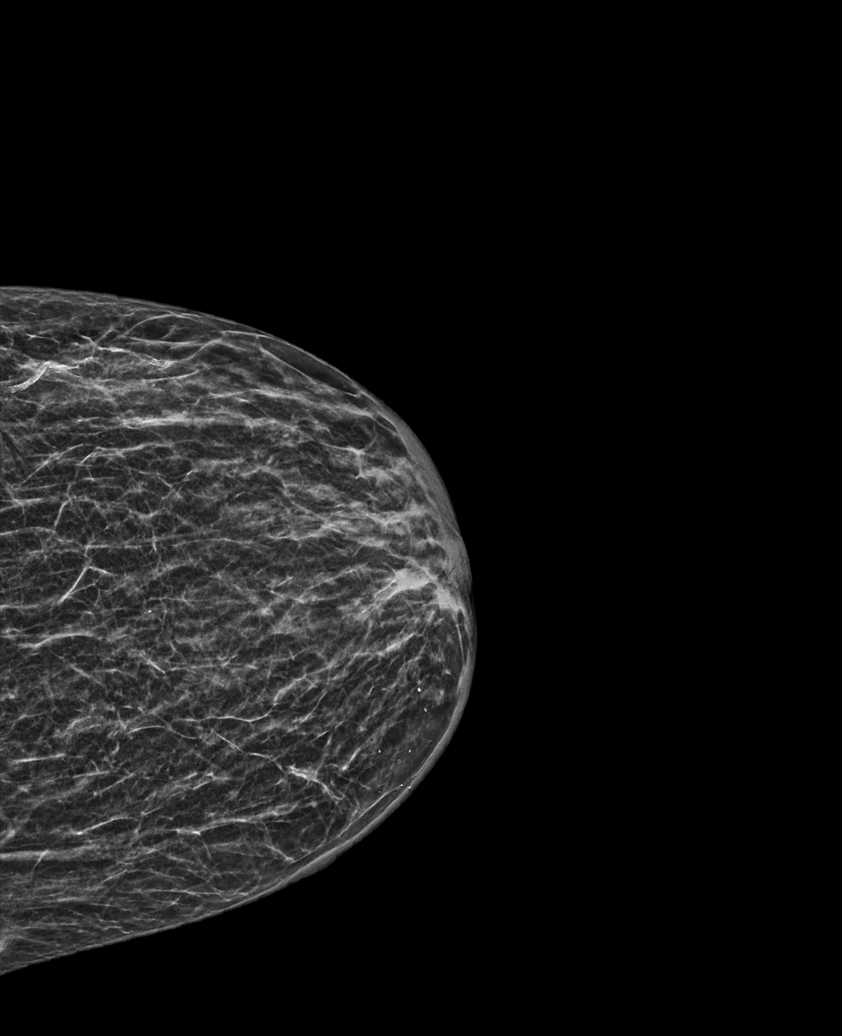

[L CC synth-2D (2 of 2)]
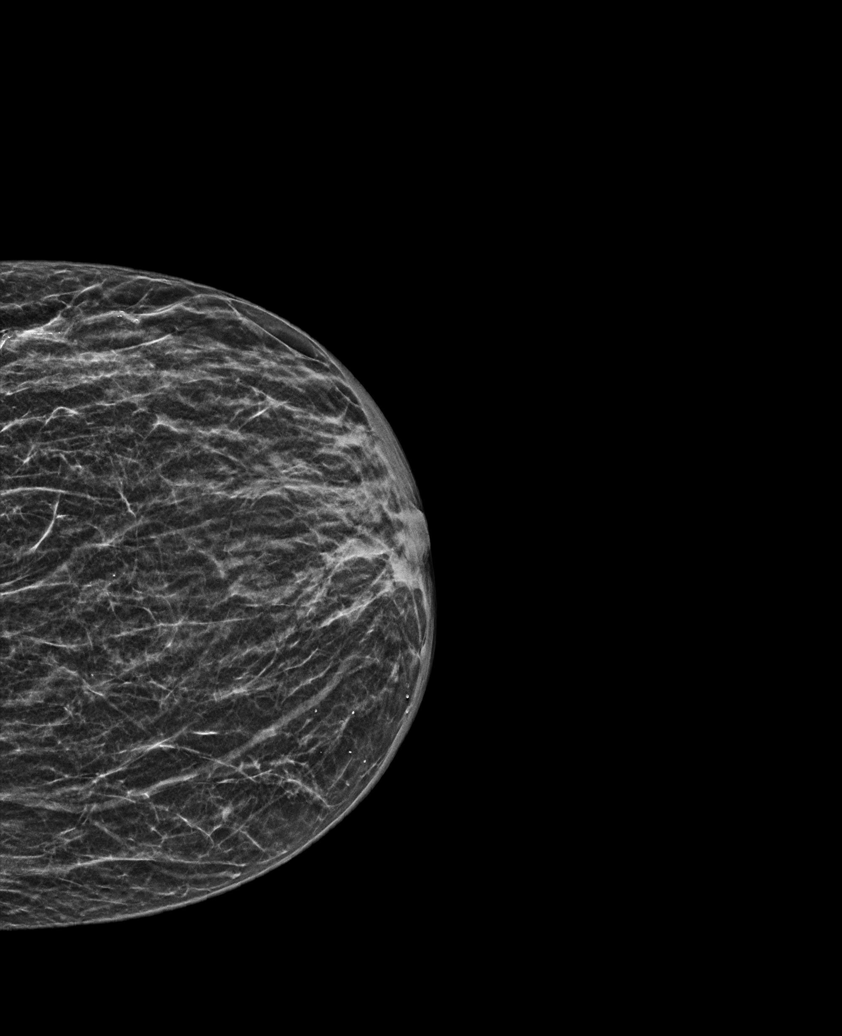

[L CC tomo · tomo slice 17/34.0]
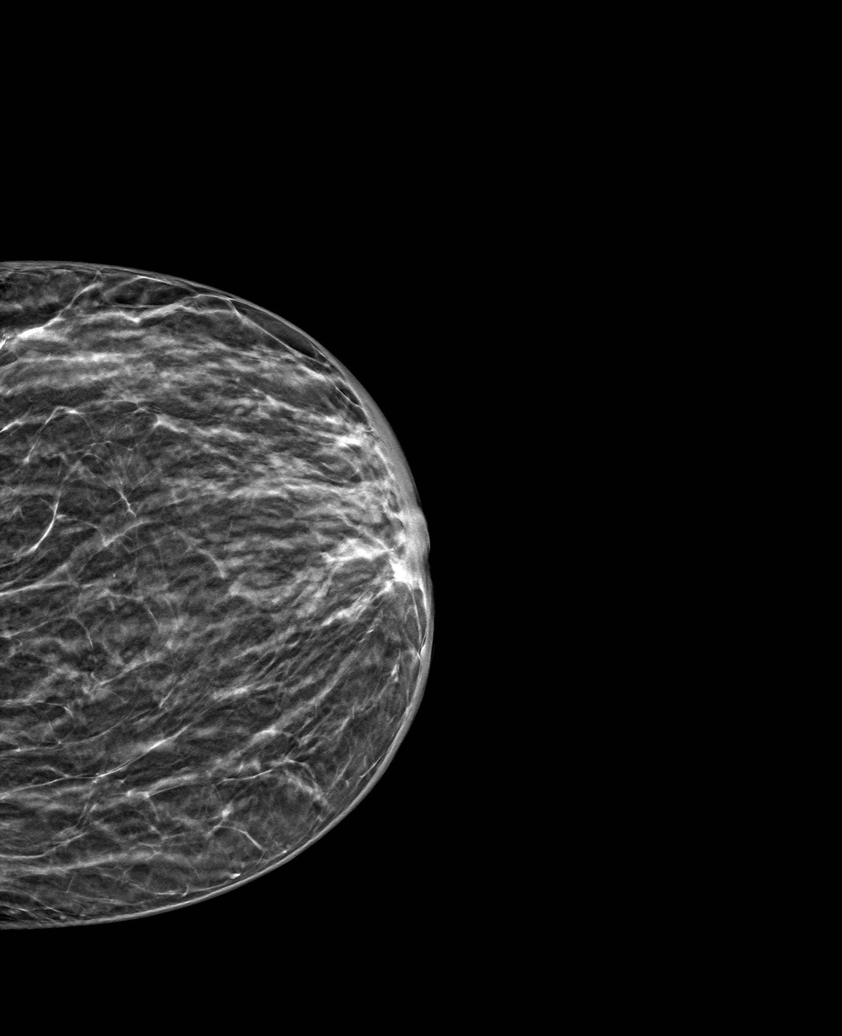

[6 of 30 positions shown; findings below may reference images not displayed]

ACR Breast Density Category b: There are scattered areas of
fibroglandular density.
FINDINGS: There are no findings suspicious for malignancy. Images were
processed with CAD.
IMPRESSION: No mammographic evidence of malignancy. A result letter of this
screening mammogram will be mailed directly to the patient.

RECOMMENDATION:
Screening mammogram in one year. (Code:[id number])

BI-RADS CATEGORY  1: Negative.

## 2021-04-06 DIAGNOSIS — E119 Type 2 diabetes mellitus without complications: Secondary | ICD-10-CM | POA: Diagnosis not present

## 2021-04-06 DIAGNOSIS — I10 Essential (primary) hypertension: Secondary | ICD-10-CM | POA: Diagnosis not present

## 2021-04-13 DIAGNOSIS — I10 Essential (primary) hypertension: Secondary | ICD-10-CM | POA: Diagnosis not present

## 2021-04-13 DIAGNOSIS — D72819 Decreased white blood cell count, unspecified: Secondary | ICD-10-CM | POA: Diagnosis not present

## 2021-04-13 DIAGNOSIS — D126 Benign neoplasm of colon, unspecified: Secondary | ICD-10-CM | POA: Diagnosis not present

## 2021-04-13 DIAGNOSIS — R946 Abnormal results of thyroid function studies: Secondary | ICD-10-CM | POA: Diagnosis not present

## 2021-04-13 DIAGNOSIS — M109 Gout, unspecified: Secondary | ICD-10-CM | POA: Diagnosis not present

## 2021-04-13 DIAGNOSIS — M19012 Primary osteoarthritis, left shoulder: Secondary | ICD-10-CM | POA: Diagnosis not present

## 2021-04-13 DIAGNOSIS — E1129 Type 2 diabetes mellitus with other diabetic kidney complication: Secondary | ICD-10-CM | POA: Diagnosis not present

## 2021-04-13 DIAGNOSIS — E7800 Pure hypercholesterolemia, unspecified: Secondary | ICD-10-CM | POA: Diagnosis not present

## 2021-04-27 DIAGNOSIS — Z961 Presence of intraocular lens: Secondary | ICD-10-CM | POA: Diagnosis not present

## 2021-04-27 DIAGNOSIS — H401123 Primary open-angle glaucoma, left eye, severe stage: Secondary | ICD-10-CM | POA: Diagnosis not present

## 2021-04-27 DIAGNOSIS — E119 Type 2 diabetes mellitus without complications: Secondary | ICD-10-CM | POA: Diagnosis not present

## 2021-04-27 DIAGNOSIS — H401112 Primary open-angle glaucoma, right eye, moderate stage: Secondary | ICD-10-CM | POA: Diagnosis not present

## 2021-07-04 ENCOUNTER — Encounter: Payer: Self-pay | Admitting: Gastroenterology

## 2021-07-21 ENCOUNTER — Encounter: Payer: Self-pay | Admitting: Gastroenterology

## 2021-07-21 ENCOUNTER — Ambulatory Visit (INDEPENDENT_AMBULATORY_CARE_PROVIDER_SITE_OTHER): Payer: Medicare Other | Admitting: Gastroenterology

## 2021-07-21 VITALS — BP 124/64 | HR 73 | Ht 62.0 in | Wt 169.2 lb

## 2021-07-21 DIAGNOSIS — Z86010 Personal history of colonic polyps: Secondary | ICD-10-CM | POA: Diagnosis not present

## 2021-07-21 NOTE — Patient Instructions (Signed)
We will obtain your colonoscopy records from [facility]  Call the office in a few weeks if you have not heard from [place]  If you are age 79 or older, your body mass index should be between 23-30. Your Body mass index is 30.96 kg/m. If this is out of the aforementioned range listed, please consider follow up with your Primary Care Provider.  If you are age 5 or younger, your body mass index should be between 19-25. Your Body mass index is 30.96 kg/m. If this is out of the aformentioned range listed, please consider follow up with your Primary Care Provider.   ________________________________________________________  [facility] providers would like to encourage you to use [facility] to communicate with providers for non-urgent requests or questions.  Due to long hold times on the telephone, sending your provider a message by [facility] may be a faster and more efficient way to get a response.  Please allow 48 business hours for a response.  Please remember that this is for non-urgent requests.  _______________________________________________________   I appreciate the  opportunity to care for you  Thank You   Janett Billow Zehr,PA-C

## 2021-07-21 NOTE — Progress Notes (Addendum)
07/21/2021 Lurine Imel [id number] [birth year]   HISTORY OF PRESENT ILLNESS:  This is a 81 year old female who is new to our office, referred here by Dr. Theda Sers for colonoscopy.  The patient reports that she has a history of colon polyps and has had a couple of colonoscopies by Dr. Collene Mares, the last she says was 2 years ago.  She did not receive a recall letter that from them, but her PCP told her she needed a screening colonoscopy.  She does not want to return to Dr. Collene Mares as she says that she had a bad experience last time.  She denies absolutely any GI complaints.  Says that she feels well.  Moves her bowels regularly.  No rectal bleeding.  Review of her most recent labs look good, normal hemoglobin, etc.   Past Medical History:  Diagnosis Date   Diabetes mellitus    type 2   Dyslipidemia    Gout    Hypertension    Refractory / Severe   Past Surgical History:  Procedure Laterality Date   ABDOMINAL HYSTERECTOMY     partial   BREAST BIOPSY Right 05/24/2017   CATARACT EXTRACTION     COLONOSCOPY      reports that she has never smoked. She has never used smokeless tobacco. She reports that she does not drink alcohol and does not use drugs. family history includes Cancer (age of onset: 42) in her mother; Diabetes in her daughter; Heart attack (age of onset: 62) in her father. Allergies  Allergen Reactions   Naproxen Other (See Comments)      Outpatient Encounter Medications as of 07/21/2021  Medication Sig   amLODipine-olmesartan (AZOR) 10-40 MG tablet Take 1 tablet by mouth daily.   carvedilol (COREG) 25 MG tablet Take 25 mg by mouth 2 (two) times daily with a meal.   Cholecalciferol (VITAMIN D) 2000 units tablet Take 2,000 Units by mouth daily.   cloNIDine (CATAPRES) 0.3 MG tablet Take 0.3 mg by mouth 3 (three) times daily.   colchicine 0.6 MG tablet Take 0.12 mg by mouth daily as needed.   dorzolamide-timolol (COSOPT) 22.3-6.8 MG/ML ophthalmic solution Place 1 drop into  both eyes 2 (two) times daily.   hydrALAZINE (APRESOLINE) 25 MG tablet TAKE 1 AND 1/2 TABLETS(37.5 MG) BY MOUTH DAILY   latanoprost (XALATAN) 0.005 % ophthalmic solution Place 1 drop into both eyes at bedtime.   metFORMIN (GLUCOPHAGE) 1000 MG tablet Take 1,000 mg by mouth daily with breakfast.    pioglitazone (ACTOS) 45 MG tablet Take 45 mg by mouth daily.   simvastatin (ZOCOR) 10 MG tablet Take 10 mg by mouth daily.   [DISCONTINUED] Calcium Carb-Cholecalciferol (CALCIUM 600 + D PO) Take by mouth. (Patient not taking: Reported on 07/21/2021)   [DISCONTINUED] HYDROcodone-acetaminophen (NORCO/VICODIN) 5-325 MG tablet Take 1 tablet by mouth every 6 (six) hours as needed for severe pain. (Patient not taking: Reported on 07/21/2021)   No facility-administered encounter medications on file as of 07/21/2021.     REVIEW OF SYSTEMS  : All other systems reviewed and negative except where noted in the History of Present Illness.   PHYSICAL EXAM: BP 124/64   Pulse 73   Ht '5\' 2"'$  (1.575 m)   Wt 169 lb 4 oz (76.8 kg)   BMI 30.96 kg/m  General: Well developed female in no acute distress Head: Normocephalic and atraumatic Eyes:  Sclerae anicteric, conjunctiva pink. Ears: Normal auditory acuity Lungs: Clear throughout to auscultation; no W/R/R. Heart:  Regular rate and rhythm; no M/R/G. Abdomen: Soft, non-distended.  BS present.  Non-tender. Musculoskeletal: Symmetrical with no gross deformities  Skin: No lesions on visible extremities Extremities: No edema  Neurological: Alert oriented x 4, grossly non-focal Psychological:  Alert and cooperative. Normal mood and affect  ASSESSMENT AND PLAN: *Personal history of colon polyps: Patient reports that she has a history of colon polyps and has had a couple of colonoscopies by Dr. Collene Mares, the last she says was 2 years ago.  She did not receive a recall letter that from them, but her PCP told her she needed a screening colonoscopy.  She does not want to return  to Dr. Collene Mares as she says that she had a bad experience last time.  I am not sure that she needs a repeat colonoscopy at this point.  We will make that determination pending review of results of her last couple colonoscopies.  She is also almost 81 years old so would want to repeat it unless absolutely necessary.  We can request those records before making any further plans.  **Addendum: Colonoscopy report received from Dr. Lorie Apley office.  Looks like she had a colonoscopy in September 2009 at which time she had a couple of early sigmoid diverticula and one small sessile polyp removed from the right colon but there was a lot of residual stool.  That polyp was a tubular adenoma.  Then she had a colonoscopy in November 2014 that showed 2 polyps that were removed from the ascending colon as well as diverticulosis and small internal hemorrhoids.  Those polyps were tubular adenoma and cauterized hyperplastic polyps.  Dr. Collene Mares had recommended a repeat in 5 years.   CC:  Janie Morning, DO

## 2021-07-22 NOTE — Progress Notes (Signed)
____________________________________________________________  Attending physician addendum:  Thank you for sending this case to me. I have reviewed the entire note and agree with the plan.  Agreed, we need further records to determine if this patient requires any further routine colonoscopies at her age.  Wilfrid Lund, MD  ____________________________________________________________

## 2021-08-04 ENCOUNTER — Telehealth: Payer: Self-pay | Admitting: *Deleted

## 2021-08-04 NOTE — Telephone Encounter (Signed)
Received GI records from [place] and they are Crystal Fowler desk for review

## 2021-08-12 ENCOUNTER — Telehealth: Payer: Self-pay

## 2021-08-12 NOTE — Telephone Encounter (Signed)
-----   Message from Loralie Champagne, PA-C sent at 08/11/2021  5:12 PM EDT ----- Please let the patient know that I received her colonoscopy results from Dr. Lorie Apley office.  Looks like she had a colonoscopy in 2009 and then in 2014.  I do not have any more recent than that.  In 2014 Dr. Collene Mares had recommended a repeat again in 5 years, which would have made that 2019 and makes her overdue for her colonoscopy.  Unless she were to have had it done somewhere else more recently then I would recommend that she proceed with colonoscopy once more with Dr. Loletha Carrow.  This would likely be her last.  Thank you,  Jess

## 2021-08-12 NOTE — Telephone Encounter (Signed)
I spoke with the pt and she agrees to the colon at this time.  I have scheduled her for previsit and colon with Dr Loletha Carrow.

## 2021-08-25 ENCOUNTER — Ambulatory Visit (AMBULATORY_SURGERY_CENTER): Payer: Self-pay | Admitting: *Deleted

## 2021-08-25 VITALS — Ht 62.0 in | Wt 172.0 lb

## 2021-08-25 DIAGNOSIS — Z86010 Personal history of colonic polyps: Secondary | ICD-10-CM

## 2021-08-25 MED ORDER — NA SULFATE-K SULFATE-MG SULF 17.5-3.13-1.6 GM/177ML PO SOLN: 1.0000 | ORAL | 0 refills | Status: DC

## 2021-08-25 NOTE — Progress Notes (Signed)
Patient  and granddaughter is here in-person for PV. Patient denies any allergies to eggs or soy. Patient denies any problems with anesthesia/sedation. Patient is not on any oxygen at home. Patient is not taking any diet/weight loss medications or blood thinners. Went over procedure prep instructions with the patient. Patient is aware of our care-partner policy. Patient notified to use Good[facility]Rx for prescription.

## 2021-09-01 ENCOUNTER — Ambulatory Visit (AMBULATORY_SURGERY_CENTER): Payer: Medicare Other | Admitting: Gastroenterology

## 2021-09-01 ENCOUNTER — Encounter: Payer: Self-pay | Admitting: Gastroenterology

## 2021-09-01 VITALS — BP 130/75 | HR 74 | Temp 98.2°F | Resp 16 | Ht 62.0 in | Wt 169.0 lb

## 2021-09-01 DIAGNOSIS — D122 Benign neoplasm of ascending colon: Secondary | ICD-10-CM

## 2021-09-01 DIAGNOSIS — Z86010 Personal history of colonic polyps: Secondary | ICD-10-CM | POA: Diagnosis not present

## 2021-09-01 DIAGNOSIS — Z09 Encounter for follow-up examination after completed treatment for conditions other than malignant neoplasm: Secondary | ICD-10-CM

## 2021-09-01 MED ORDER — SODIUM CHLORIDE 0.9 % IV SOLN: 500.0000 mL | Freq: Once | INTRAVENOUS | Status: DC

## 2021-09-01 NOTE — Progress Notes (Signed)
PT taken to PACU. Monitors in place. VSS. Report given to RN. 

## 2021-09-01 NOTE — Progress Notes (Signed)
History and Physical:  This patient presents for endoscopic testing for: Encounter Diagnosis  Name Primary?   History of colonic polyps Yes    Last colonoscopy 2014 with 16m TA x 2 from ascending colon (Dr. MCollene Mares Patient denies chronic abdominal pain, rectal bleeding, constipation or diarrhea. See in our office 07/21/21  Patient is otherwise without complaints or active issues today.   Past Medical History: Past Medical History:  Diagnosis Date   Diabetes mellitus    type 2   Dyslipidemia    Glaucoma    Gout    Hypertension    Refractory / Severe     Past Surgical History: Past Surgical History:  Procedure Laterality Date   ABDOMINAL HYSTERECTOMY     partial   BREAST BIOPSY Right 05/24/2017   CATARACT EXTRACTION     COLONOSCOPY  12/2012   Dr.Mann    Allergies: Allergies  Allergen Reactions   Naproxen Other (See Comments)    Outpatient Meds: Current Outpatient Medications  Medication Sig Dispense Refill   acetaminophen (TYLENOL) 500 MG tablet Take 500 mg by mouth every 6 (six) hours as needed.     amLODipine-olmesartan (AZOR) 10-40 MG tablet Take 1 tablet by mouth daily.     carvedilol (COREG) 25 MG tablet Take 25 mg by mouth 2 (two) times daily with a meal.     Cholecalciferol (VITAMIN D) 2000 units tablet Take 2,000 Units by mouth daily.     cloNIDine (CATAPRES) 0.3 MG tablet Take 0.3 mg by mouth 3 (three) times daily.     dorzolamide-timolol (COSOPT) 22.3-6.8 MG/ML ophthalmic solution Place 1 drop into both eyes 2 (two) times daily.     hydrALAZINE (APRESOLINE) 25 MG tablet TAKE 1 AND 1/2 TABLETS(37.5 MG) BY MOUTH DAILY 135 tablet 1   latanoprost (XALATAN) 0.005 % ophthalmic solution Place 1 drop into both eyes at bedtime.     metFORMIN (GLUCOPHAGE) 1000 MG tablet Take 1,000 mg by mouth daily with breakfast.      pioglitazone (ACTOS) 45 MG tablet Take 45 mg by mouth daily.     simvastatin (ZOCOR) 10 MG tablet Take 10 mg by mouth daily.     colchicine 0.6 MG  tablet Take 0.12 mg by mouth daily as needed.     Current Facility-Administered Medications  Medication Dose Route Frequency Provider Last Rate Last Admin   0.9 %  sodium chloride infusion  500 mL Intravenous Once Danis, HEstill CottaIII, MD          ___________________________________________________________________ Objective   Exam:  BP (!) 154/78   Pulse 72   Temp 98.2 F (36.8 C) (Temporal)   Ht '5\' 2"'$  (1.575 m)   Wt 169 lb (76.7 kg)   SpO2 96%   BMI 30.91 kg/m   CV: RRR without murmur, S1/S2 Resp: clear to auscultation bilaterally, normal RR and effort noted GI: soft, no tenderness, with active bowel sounds.   Assessment: Encounter Diagnosis  Name Primary?   History of colonic polyps Yes     Plan: Colonoscopy  The benefits and risks of the planned procedure were described in detail with the patient or (when appropriate) their health care proxy.  Risks were outlined as including, but not limited to, bleeding, infection, perforation, adverse medication reaction leading to cardiac or pulmonary decompensation, pancreatitis (if ERCP).  The limitation of incomplete mucosal visualization was also discussed.  No guarantees or warranties were given.    The patient is appropriate for an endoscopic procedure in the ambulatory setting.   -  Wilfrid Lund, MD

## 2021-09-01 NOTE — Patient Instructions (Addendum)
Handout provided about diverticulosis and polyps.  Resume previous diet.  Continue current medications.  Await pathology results. No repeat surveillance colonoscopy recommended.  YOU HAD AN ENDOSCOPIC PROCEDURE TODAY AT [facility]:   Refer to the procedure report that was given to you for any specific questions about what was found during the examination.  If the procedure report does not answer your questions, please call your gastroenterologist to clarify.  If you requested that your care partner not be given the details of your procedure findings, then the procedure report has been included in a sealed envelope for you to review at your convenience later.  YOU SHOULD EXPECT: Some feelings of bloating in the abdomen. Passage of more gas than usual.  Walking can help get rid of the air that was put into your GI tract during the procedure and reduce the bloating. If you had a lower endoscopy (such as a colonoscopy or flexible sigmoidoscopy) you may notice spotting of blood in your stool or on the toilet paper. If you underwent a bowel prep for your procedure, you may not have a normal bowel movement for a few days.  Please Note:  You might notice some irritation and congestion in your nose or some drainage.  This is from the oxygen used during your procedure.  There is no need for concern and it should clear up in a day or so.  SYMPTOMS TO REPORT IMMEDIATELY:  Following lower endoscopy (colonoscopy or flexible sigmoidoscopy):  Excessive amounts of blood in the stool  Significant tenderness or worsening of abdominal pains  Swelling of the abdomen that is new, acute  Fever of 100F or higher  For urgent or emergent issues, a gastroenterologist can be reached at any hour by calling [phone]. Do not use MyChart messaging for urgent concerns.    DIET:  We do recommend a small meal at first, but then you may proceed to your regular diet.  Drink plenty of fluids but you should  avoid alcoholic beverages for 24 hours.  ACTIVITY:  You should plan to take it easy for the rest of today and you should NOT DRIVE or use heavy machinery until tomorrow (because of the sedation medicines used during the test).    FOLLOW UP: Our staff will call the number listed on your records the next business day following your procedure.  We will call around 7:15- 8:00 am to check on you and address any questions or concerns that you may have regarding the information given to you following your procedure. If we do not reach you, we will leave a message.  If you develop any symptoms (ie: fever, flu-like symptoms, shortness of breath, cough etc.) before then, please call [phone].  If you test positive for Covid 19 in the 2 weeks post procedure, please call and report this information to [place].    If any biopsies were taken you will be contacted by phone or by letter within the next 1-3 weeks.  Please call us at [phone] if you have not heard about the biopsies in 3 weeks.    SIGNATURES/CONFIDENTIALITY: You and/or your care partner have signed paperwork which will be entered into your electronic medical record.  These signatures attest to the fact that that the information above on your After Visit Summary has been reviewed and is understood.  Full responsibility of the confidentiality of this discharge information lies with you and/or your care-partner.

## 2021-09-01 NOTE — Progress Notes (Signed)
Called to room to assist during endoscopic procedure.  Patient ID and intended procedure confirmed with present staff. Received instructions for my participation in the procedure from the performing physician.  

## 2021-09-01 NOTE — Progress Notes (Signed)
Pt's states no medical or surgical changes since previsit or office visit. 

## 2021-09-01 NOTE — Op Note (Signed)
[place] Patient Name: Crystal Fowler Procedure Date: 09/01/2021 1:51 PM MRN: [id number] Endoscopist: Mallie Mussel L. Loletha Carrow , MD Age: 81 Referring MD:  Date of Birth: [birth year] Gender: Female Account #: [id number] Procedure:                Colonoscopy Indications:              Surveillance: Personal history of adenomatous                            polyps on last colonoscopy > 5 years ago Medicines:                Monitored Anesthesia Care Procedure:                Pre-Anesthesia Assessment:                           - Prior to the procedure, a History and Physical                            was performed, and patient medications and                            allergies were reviewed. The patient's tolerance of                            previous anesthesia was also reviewed. The risks                            and benefits of the procedure and the sedation                            options and risks were discussed with the patient.                            All questions were answered, and informed consent                            was obtained. Prior Anticoagulants: The patient has                            taken no previous anticoagulant or antiplatelet                            agents. ASA Grade Assessment: III - A patient with                            severe systemic disease. After reviewing the risks                            and benefits, the patient was deemed in                            satisfactory condition to undergo the procedure.  After obtaining informed consent, the colonoscope                            was passed under direct vision. Throughout the                            procedure, the patient's blood pressure, pulse, and                            oxygen saturations were monitored continuously. The                            Olympus CF-HQ190L [id number]) Colonoscope was                            introduced through the anus  and advanced to the the                            cecum, identified by appendiceal orifice and                            ileocecal valve. The colonoscopy was somewhat                            difficult due to a redundant colon. Successful                            completion of the procedure was aided by using                            manual pressure, straightening and shortening the                            scope to obtain bowel loop reduction and lavage.                            The patient tolerated the procedure well. The                            quality of the bowel preparation was fair in some                            areas, good in others despite lavage (residual                            fibrous material). The bowel preparation used was                            GoLYTELY. Scope In: 3:20:50 PM Scope Out: 3:35:52 PM Scope Withdrawal Time: 0 hours 8 minutes 28 seconds  Total Procedure Duration: 0 hours 15 minutes 2 seconds  Findings:                 The digital rectal exam findings include decreased  sphincter tone.                           A diminutive polyp was found in the ascending                            colon. The polyp was sessile. The polyp was removed                            with a cold snare. Resection and retrieval were                            complete.                           Multiple diverticula were found in the left colon.                           Retroflexion in the rectum was not performed due to                            anatomy.                           The exam was otherwise without abnormality. Complications:            No immediate complications. Estimated Blood Loss:     Estimated blood loss was minimal. Impression:               - Preparation of the colon was fair.                           - Decreased sphincter tone found on digital rectal                            exam.                           -  One diminutive polyp in the ascending colon,                            removed with a cold snare. Resected and retrieved.                           - Diverticulosis in the left colon.                           - The examination was otherwise normal. Recommendation:           - Patient has a contact number available for                            emergencies. The signs and symptoms of potential                            delayed complications were discussed with the  patient. Return to normal activities tomorrow.                            Written discharge instructions were provided to the                            patient.                           - Resume previous diet.                           - Continue present medications.                           - Await pathology results.                           - No repeat surveillance colonoscopy recommended. Braydon Kullman L. Loletha Carrow, MD 09/01/2021 3:43:12 PM This report has been signed electronically.

## 2021-09-08 ENCOUNTER — Encounter: Payer: Self-pay | Admitting: Gastroenterology

## 2021-10-03 DIAGNOSIS — M109 Gout, unspecified: Secondary | ICD-10-CM | POA: Diagnosis not present

## 2021-10-03 DIAGNOSIS — D72819 Decreased white blood cell count, unspecified: Secondary | ICD-10-CM | POA: Diagnosis not present

## 2021-10-03 DIAGNOSIS — E1129 Type 2 diabetes mellitus with other diabetic kidney complication: Secondary | ICD-10-CM | POA: Diagnosis not present

## 2021-10-03 DIAGNOSIS — R946 Abnormal results of thyroid function studies: Secondary | ICD-10-CM | POA: Diagnosis not present

## 2021-10-03 DIAGNOSIS — E7800 Pure hypercholesterolemia, unspecified: Secondary | ICD-10-CM | POA: Diagnosis not present

## 2021-10-03 DIAGNOSIS — D126 Benign neoplasm of colon, unspecified: Secondary | ICD-10-CM | POA: Diagnosis not present

## 2021-10-03 DIAGNOSIS — M19012 Primary osteoarthritis, left shoulder: Secondary | ICD-10-CM | POA: Diagnosis not present

## 2021-10-03 DIAGNOSIS — I10 Essential (primary) hypertension: Secondary | ICD-10-CM | POA: Diagnosis not present

## 2021-10-10 DIAGNOSIS — E1129 Type 2 diabetes mellitus with other diabetic kidney complication: Secondary | ICD-10-CM | POA: Diagnosis not present

## 2021-10-10 DIAGNOSIS — Z0000 Encounter for general adult medical examination without abnormal findings: Secondary | ICD-10-CM | POA: Diagnosis not present

## 2021-10-10 DIAGNOSIS — M109 Gout, unspecified: Secondary | ICD-10-CM | POA: Diagnosis not present

## 2021-10-10 DIAGNOSIS — E7800 Pure hypercholesterolemia, unspecified: Secondary | ICD-10-CM | POA: Diagnosis not present

## 2021-10-10 DIAGNOSIS — D72819 Decreased white blood cell count, unspecified: Secondary | ICD-10-CM | POA: Diagnosis not present

## 2021-10-10 DIAGNOSIS — Z23 Encounter for immunization: Secondary | ICD-10-CM | POA: Diagnosis not present

## 2021-10-10 DIAGNOSIS — I10 Essential (primary) hypertension: Secondary | ICD-10-CM | POA: Diagnosis not present

## 2021-11-21 ENCOUNTER — Other Ambulatory Visit: Payer: Self-pay | Admitting: Family Medicine

## 2021-11-21 DIAGNOSIS — Z1231 Encounter for screening mammogram for malignant neoplasm of breast: Secondary | ICD-10-CM

## 2021-11-22 DIAGNOSIS — E119 Type 2 diabetes mellitus without complications: Secondary | ICD-10-CM | POA: Diagnosis not present

## 2021-12-09 ENCOUNTER — Ambulatory Visit: Payer: Medicare Other

## 2021-12-09 DIAGNOSIS — H401123 Primary open-angle glaucoma, left eye, severe stage: Secondary | ICD-10-CM | POA: Diagnosis not present

## 2021-12-09 DIAGNOSIS — E119 Type 2 diabetes mellitus without complications: Secondary | ICD-10-CM | POA: Diagnosis not present

## 2021-12-09 DIAGNOSIS — Z961 Presence of intraocular lens: Secondary | ICD-10-CM | POA: Diagnosis not present

## 2021-12-09 DIAGNOSIS — H401112 Primary open-angle glaucoma, right eye, moderate stage: Secondary | ICD-10-CM | POA: Diagnosis not present

## 2021-12-23 ENCOUNTER — Ambulatory Visit
Admission: RE | Admit: 2021-12-23 | Discharge: 2021-12-23 | Disposition: A | Discharge status: Home / Self Care | Payer: Medicare Other | Source: Ambulatory Visit | Attending: Family Medicine | Admitting: Family Medicine

## 2021-12-23 DIAGNOSIS — Z1231 Encounter for screening mammogram for malignant neoplasm of breast: Secondary | ICD-10-CM

## 2022-04-05 DIAGNOSIS — E7800 Pure hypercholesterolemia, unspecified: Secondary | ICD-10-CM | POA: Diagnosis not present

## 2022-04-05 DIAGNOSIS — E1129 Type 2 diabetes mellitus with other diabetic kidney complication: Secondary | ICD-10-CM | POA: Diagnosis not present

## 2022-04-05 DIAGNOSIS — I10 Essential (primary) hypertension: Secondary | ICD-10-CM | POA: Diagnosis not present

## 2022-04-05 DIAGNOSIS — D72819 Decreased white blood cell count, unspecified: Secondary | ICD-10-CM | POA: Diagnosis not present

## 2022-04-12 DIAGNOSIS — H401112 Primary open-angle glaucoma, right eye, moderate stage: Secondary | ICD-10-CM | POA: Diagnosis not present

## 2022-04-12 DIAGNOSIS — Z961 Presence of intraocular lens: Secondary | ICD-10-CM | POA: Diagnosis not present

## 2022-04-12 DIAGNOSIS — H401123 Primary open-angle glaucoma, left eye, severe stage: Secondary | ICD-10-CM | POA: Diagnosis not present

## 2022-04-12 DIAGNOSIS — E119 Type 2 diabetes mellitus without complications: Secondary | ICD-10-CM | POA: Diagnosis not present

## 2022-04-14 DIAGNOSIS — M169 Osteoarthritis of hip, unspecified: Secondary | ICD-10-CM | POA: Diagnosis not present

## 2022-04-14 DIAGNOSIS — M8580 Other specified disorders of bone density and structure, unspecified site: Secondary | ICD-10-CM | POA: Diagnosis not present

## 2022-04-14 DIAGNOSIS — M109 Gout, unspecified: Secondary | ICD-10-CM | POA: Diagnosis not present

## 2022-04-14 DIAGNOSIS — E118 Type 2 diabetes mellitus with unspecified complications: Secondary | ICD-10-CM | POA: Diagnosis not present

## 2022-04-14 DIAGNOSIS — I10 Essential (primary) hypertension: Secondary | ICD-10-CM | POA: Diagnosis not present

## 2022-04-14 DIAGNOSIS — D72819 Decreased white blood cell count, unspecified: Secondary | ICD-10-CM | POA: Diagnosis not present

## 2022-04-14 DIAGNOSIS — E7800 Pure hypercholesterolemia, unspecified: Secondary | ICD-10-CM | POA: Diagnosis not present

## 2022-04-14 DIAGNOSIS — R7989 Other specified abnormal findings of blood chemistry: Secondary | ICD-10-CM | POA: Diagnosis not present

## 2022-08-09 DIAGNOSIS — H401112 Primary open-angle glaucoma, right eye, moderate stage: Secondary | ICD-10-CM | POA: Diagnosis not present

## 2022-08-09 DIAGNOSIS — Z961 Presence of intraocular lens: Secondary | ICD-10-CM | POA: Diagnosis not present

## 2022-08-09 DIAGNOSIS — E119 Type 2 diabetes mellitus without complications: Secondary | ICD-10-CM | POA: Diagnosis not present

## 2022-08-09 DIAGNOSIS — H401123 Primary open-angle glaucoma, left eye, severe stage: Secondary | ICD-10-CM | POA: Diagnosis not present

## 2022-10-09 DIAGNOSIS — E7800 Pure hypercholesterolemia, unspecified: Secondary | ICD-10-CM | POA: Diagnosis not present

## 2022-10-09 DIAGNOSIS — D72819 Decreased white blood cell count, unspecified: Secondary | ICD-10-CM | POA: Diagnosis not present

## 2022-10-09 DIAGNOSIS — I10 Essential (primary) hypertension: Secondary | ICD-10-CM | POA: Diagnosis not present

## 2022-10-09 DIAGNOSIS — R7989 Other specified abnormal findings of blood chemistry: Secondary | ICD-10-CM | POA: Diagnosis not present

## 2022-10-09 DIAGNOSIS — E118 Type 2 diabetes mellitus with unspecified complications: Secondary | ICD-10-CM | POA: Diagnosis not present

## 2022-10-13 DIAGNOSIS — E1129 Type 2 diabetes mellitus with other diabetic kidney complication: Secondary | ICD-10-CM | POA: Diagnosis not present

## 2022-10-13 DIAGNOSIS — E118 Type 2 diabetes mellitus with unspecified complications: Secondary | ICD-10-CM | POA: Diagnosis not present

## 2022-10-13 DIAGNOSIS — M169 Osteoarthritis of hip, unspecified: Secondary | ICD-10-CM | POA: Diagnosis not present

## 2022-10-13 DIAGNOSIS — I10 Essential (primary) hypertension: Secondary | ICD-10-CM | POA: Diagnosis not present

## 2022-10-13 DIAGNOSIS — E7800 Pure hypercholesterolemia, unspecified: Secondary | ICD-10-CM | POA: Diagnosis not present

## 2022-10-13 DIAGNOSIS — Z0000 Encounter for general adult medical examination without abnormal findings: Secondary | ICD-10-CM | POA: Diagnosis not present

## 2022-11-15 ENCOUNTER — Other Ambulatory Visit: Payer: Self-pay | Admitting: Family Medicine

## 2022-11-15 ENCOUNTER — Encounter: Payer: Self-pay | Admitting: Family Medicine

## 2022-11-15 DIAGNOSIS — Z1231 Encounter for screening mammogram for malignant neoplasm of breast: Secondary | ICD-10-CM

## 2022-12-25 ENCOUNTER — Ambulatory Visit: Payer: Medicare Other

## 2022-12-26 ENCOUNTER — Ambulatory Visit
Admission: RE | Admit: 2022-12-26 | Discharge: 2022-12-26 | Disposition: A | Discharge status: Home / Self Care | Payer: 59 | Source: Ambulatory Visit | Attending: Family Medicine | Admitting: Family Medicine

## 2022-12-26 DIAGNOSIS — Z1231 Encounter for screening mammogram for malignant neoplasm of breast: Secondary | ICD-10-CM | POA: Diagnosis not present

## 2023-01-02 DIAGNOSIS — Z961 Presence of intraocular lens: Secondary | ICD-10-CM | POA: Diagnosis not present

## 2023-01-02 DIAGNOSIS — H401123 Primary open-angle glaucoma, left eye, severe stage: Secondary | ICD-10-CM | POA: Diagnosis not present

## 2023-01-02 DIAGNOSIS — E119 Type 2 diabetes mellitus without complications: Secondary | ICD-10-CM | POA: Diagnosis not present

## 2023-01-02 DIAGNOSIS — H401112 Primary open-angle glaucoma, right eye, moderate stage: Secondary | ICD-10-CM | POA: Diagnosis not present

## 2023-03-20 ENCOUNTER — Emergency Department (HOSPITAL_BASED_OUTPATIENT_CLINIC_OR_DEPARTMENT_OTHER): Payer: 59

## 2023-03-20 ENCOUNTER — Emergency Department (HOSPITAL_BASED_OUTPATIENT_CLINIC_OR_DEPARTMENT_OTHER)
Admission: EM | Admit: 2023-03-20 | Discharge: 2023-03-20 | Disposition: A | Discharge status: Home / Self Care | Payer: 59 | Attending: Emergency Medicine | Admitting: Emergency Medicine

## 2023-03-20 ENCOUNTER — Other Ambulatory Visit: Payer: Self-pay

## 2023-03-20 ENCOUNTER — Encounter (HOSPITAL_BASED_OUTPATIENT_CLINIC_OR_DEPARTMENT_OTHER): Payer: Self-pay | Admitting: Emergency Medicine

## 2023-03-20 DIAGNOSIS — M545 Low back pain, unspecified: Secondary | ICD-10-CM

## 2023-03-20 DIAGNOSIS — I700 Atherosclerosis of aorta: Secondary | ICD-10-CM | POA: Insufficient documentation

## 2023-03-20 DIAGNOSIS — I10 Essential (primary) hypertension: Secondary | ICD-10-CM | POA: Diagnosis not present

## 2023-03-20 DIAGNOSIS — E119 Type 2 diabetes mellitus without complications: Secondary | ICD-10-CM | POA: Insufficient documentation

## 2023-03-20 DIAGNOSIS — R109 Unspecified abdominal pain: Secondary | ICD-10-CM | POA: Diagnosis not present

## 2023-03-20 DIAGNOSIS — M5459 Other low back pain: Secondary | ICD-10-CM | POA: Diagnosis not present

## 2023-03-20 DIAGNOSIS — R10819 Abdominal tenderness, unspecified site: Secondary | ICD-10-CM | POA: Diagnosis present

## 2023-03-20 DIAGNOSIS — I708 Atherosclerosis of other arteries: Secondary | ICD-10-CM | POA: Diagnosis not present

## 2023-03-20 DIAGNOSIS — Z79899 Other long term (current) drug therapy: Secondary | ICD-10-CM | POA: Diagnosis not present

## 2023-03-20 DIAGNOSIS — Z7984 Long term (current) use of oral hypoglycemic drugs: Secondary | ICD-10-CM | POA: Diagnosis not present

## 2023-03-20 DIAGNOSIS — N390 Urinary tract infection, site not specified: Secondary | ICD-10-CM | POA: Diagnosis not present

## 2023-03-20 LAB — CBC WITH DIFFERENTIAL/PLATELET
Abs Immature Granulocytes: 0.02 10*3/uL (ref 0.00–0.07)
Basophils Absolute: 0 10*3/uL (ref 0.0–0.1)
Basophils Relative: 0 %
Eosinophils Absolute: 0.1 10*3/uL (ref 0.0–0.5)
Eosinophils Relative: 1 %
HCT: 39.7 % (ref 36.0–46.0)
Hemoglobin: 13.5 g/dL (ref 12.0–15.0)
Immature Granulocytes: 0 %
Lymphocytes Relative: 33 %
Lymphs Abs: 1.9 10*3/uL (ref 0.7–4.0)
MCH: 31.1 pg (ref 26.0–34.0)
MCHC: 34 g/dL (ref 30.0–36.0)
MCV: 91.5 fL (ref 80.0–100.0)
Monocytes Absolute: 0.3 10*3/uL (ref 0.1–1.0)
Monocytes Relative: 6 %
Neutro Abs: 3.4 10*3/uL (ref 1.7–7.7)
Neutrophils Relative %: 60 %
Platelets: 269 10*3/uL (ref 150–400)
RBC: 4.34 MIL/uL (ref 3.87–5.11)
RDW: 14.3 % (ref 11.5–15.5)
WBC: 5.8 10*3/uL (ref 4.0–10.5)
nRBC: 0 % (ref 0.0–0.2)

## 2023-03-20 LAB — BASIC METABOLIC PANEL
Anion gap: 8 (ref 5–15)
BUN: 15 mg/dL (ref 8–23)
CO2: 27 mmol/L (ref 22–32)
Calcium: 10.1 mg/dL (ref 8.9–10.3)
Chloride: 100 mmol/L (ref 98–111)
Creatinine, Ser: 0.98 mg/dL (ref 0.44–1.00)
GFR, Estimated: 58 mL/min — ABNORMAL LOW (ref >60)
Glucose, Bld: 163 mg/dL — ABNORMAL HIGH (ref 70–99)
Potassium: 3.8 mmol/L (ref 3.5–5.1)
Sodium: 135 mmol/L (ref 135–145)

## 2023-03-20 LAB — URINALYSIS, ROUTINE W REFLEX MICROSCOPIC
Bilirubin Urine: NEGATIVE
Glucose, UA: NEGATIVE mg/dL
Hgb urine dipstick: NEGATIVE
Ketones, ur: NEGATIVE mg/dL
Nitrite: POSITIVE — AB
Protein, ur: NEGATIVE mg/dL
Specific Gravity, Urine: 1.018 (ref 1.005–1.030)
pH: 6 (ref 5.0–8.0)

## 2023-03-20 MED ORDER — CEPHALEXIN 500 MG PO CAPS: 500.0000 mg | ORAL_CAPSULE | Freq: Three times a day (TID) | ORAL | 0 refills | Status: AC

## 2023-03-20 MED ORDER — OXYCODONE-ACETAMINOPHEN 5-325 MG PO TABS
1.0000 | ORAL_TABLET | Freq: Once | ORAL | Status: AC
  Administered 2023-03-20: 1 via ORAL
  Filled 2023-03-20: qty 1

## 2023-03-20 MED ORDER — SODIUM CHLORIDE 0.9 % IV SOLN
1.0000 g | Freq: Once | INTRAVENOUS | Status: AC
  Administered 2023-03-20: 1 g via INTRAVENOUS
  Filled 2023-03-20: qty 10

## 2023-03-20 NOTE — ED Notes (Signed)
ED Provider at bedside. 

## 2023-03-20 NOTE — Discharge Instructions (Signed)
Are seen today for back pain.  This is likely related to a urinary tract infection.  Take Tylenol as needed for pain.  Make sure that you are taking antibiotics.  If pain worsens or you develop fevers, you should be reevaluated.

## 2023-03-20 NOTE — ED Triage Notes (Signed)
C/o left side lower back pain starting this morning. Denies injuries to area. Denies urinary symptoms.

## 2023-03-20 NOTE — ED Provider Notes (Signed)
[place] EMERGENCY DEPARTMENT AT [facility] Provider Note   CSN: [id number] Arrival date & time: 03/20/23  1745     History  Chief Complaint  Patient presents with   Back Pain    Crystal Fowler is a 83 y.o. female.  HPI     This is an 83 year old female who presents with back pain.  Patient reports acute onset of left lower back pain that started early this morning.  It is sharp in nature.  It is nonradiating.  She is never had pain like this before.  She took something at home for pain without relief.  Denies bloody urine or dysuria.  Denies fevers.  She has never had pain like this before.  Denies any weakness, numbness, tingling of the lower extremities.  Denies heavy lifting or injury.  Home Medications Prior to Admission medications   Medication Sig Start Date End Date Taking? Authorizing Provider  acetaminophen (TYLENOL) 500 MG tablet Take 500 mg by mouth every 6 (six) hours as needed.    [provider]  amLODipine-olmesartan (AZOR) 10-40 MG tablet Take 1 tablet by mouth daily.    [provider]  carvedilol (COREG) 25 MG tablet Take 25 mg by mouth 2 (two) times daily with a meal.    [provider]  Cholecalciferol (VITAMIN D) 2000 units tablet Take 2,000 Units by mouth daily.    [provider]  cloNIDine (CATAPRES) 0.3 MG tablet Take 0.3 mg by mouth 3 (three) times daily.    [provider]  colchicine 0.6 MG tablet Take 0.12 mg by mouth daily as needed.    [provider]  dorzolamide-timolol (COSOPT) 22.3-6.8 MG/ML ophthalmic solution Place 1 drop into both eyes 2 (two) times daily. 05/23/16   [provider]  hydrALAZINE (APRESOLINE) 25 MG tablet TAKE 1 AND 1/2 TABLETS(37.5 MG) BY MOUTH DAILY 01/14/18   Rollene Rotunda, MD  latanoprost (XALATAN) 0.005 % ophthalmic solution Place 1 drop into both eyes at bedtime. 05/29/16   [provider]  metFORMIN (GLUCOPHAGE) 1000 MG tablet Take  1,000 mg by mouth daily with breakfast.     [provider]  pioglitazone (ACTOS) 45 MG tablet Take 45 mg by mouth daily.    [provider]  simvastatin (ZOCOR) 10 MG tablet Take 10 mg by mouth daily.    [provider]      Allergies    Naproxen    Review of Systems   Review of Systems  Constitutional:  Negative for fever.  Respiratory:  Negative for shortness of breath.   Cardiovascular:  Negative for chest pain.  Gastrointestinal:  Negative for abdominal pain, nausea and vomiting.  Genitourinary:  Positive for flank pain. Negative for dysuria and hematuria.  Musculoskeletal:  Positive for back pain.  All other systems reviewed and are negative.   Physical Exam Updated Vital Signs BP 114/67   Pulse 62   Temp 97.8 F (36.6 C)   Resp 17   Wt 76.7 kg   SpO2 94%   BMI 30.93 kg/m  Physical Exam Vitals and nursing note reviewed.  Constitutional:      Appearance: She is well-developed. She is obese. She is not ill-appearing.  HENT:     Head: Normocephalic and atraumatic.  Eyes:     Pupils: Pupils are equal, round, and reactive to light.  Cardiovascular:     Rate and Rhythm: Normal rate and regular rhythm.     Heart sounds: Normal heart sounds.  Pulmonary:  Effort: Pulmonary effort is normal. No respiratory distress.     Breath sounds: No wheezing.  Abdominal:     General: Bowel sounds are normal.     Palpations: Abdomen is soft.     Tenderness: There is no abdominal tenderness. There is left CVA tenderness. There is no right CVA tenderness.  Musculoskeletal:     Cervical back: Neck supple.     Comments: Left paraspinous muscle region tenderness to palpation of the lumbar spine, no midline tenderness, step-off, deformity  Skin:    General: Skin is warm and dry.  Neurological:     Mental Status: She is alert and oriented to person, place, and time.  Psychiatric:        Mood and Affect: Mood normal.     ED Results / Procedures /  Treatments   Labs (all labs ordered are listed, but only abnormal results are displayed) Labs Reviewed  BASIC METABOLIC PANEL - Abnormal; Notable for the following components:      Result Value   Glucose, Bld 163 (*)    GFR, Estimated 58 (*)    All other components within normal limits  URINALYSIS, ROUTINE W REFLEX MICROSCOPIC - Abnormal; Notable for the following components:   Nitrite POSITIVE (*)    Leukocytes,Ua MODERATE (*)    Bacteria, UA MANY (*)    All other components within normal limits  URINE CULTURE  CBC WITH DIFFERENTIAL/PLATELET    EKG None  Radiology CT Renal Stone Study Result Date: 03/20/2023 CLINICAL DATA:  Left lower back pain, flank pain EXAM: CT ABDOMEN AND PELVIS WITHOUT CONTRAST TECHNIQUE: Multidetector CT imaging of the abdomen and pelvis was performed following the standard protocol without IV contrast. RADIATION DOSE REDUCTION: This exam was performed according to the departmental dose-optimization program which includes automated exposure control, adjustment of the mA and/or kV according to patient size and/or use of iterative reconstruction technique. COMPARISON:  07/06/2016 FINDINGS: Lower chest: Linear areas of scarring or atelectasis in the lung bases. No effusions. Hepatobiliary: No focal hepatic abnormality. Gallbladder unremarkable. Pancreas: No focal abnormality or ductal dilatation. Spleen: No focal abnormality.  Normal size. Adrenals/Urinary Tract: No adrenal abnormality. No focal renal abnormality. No stones or hydronephrosis. Urinary bladder is unremarkable. Stomach/Bowel: Stomach, large and small bowel grossly unremarkable. Vascular/Lymphatic: Aortoiliac atherosclerosis. No evidence of aneurysm or adenopathy. Reproductive: Prior hysterectomy.  No adnexal masses. Other: No free fluid or free air. Musculoskeletal: No acute bony abnormality. IMPRESSION: No acute findings in the abdomen or pelvis. Aortoiliac atherosclerosis. Electronically Signed   By: Charlett Nose M.D.   On: 03/20/2023 21:21    Procedures Procedures    Medications Ordered in ED Medications  cefTRIAXone (ROCEPHIN) 1 g in sodium chloride 0.9 % 100 mL IVPB (1 g Intravenous New Bag/Given 03/20/23 2116)  oxyCODONE-acetaminophen (PERCOCET/ROXICET) 5-325 MG per tablet 1 tablet (1 tablet Oral Given 03/20/23 2021)    ED Course/ Medical Decision Making/ A&P                                 Medical Decision Making Amount and/or Complexity of Data Reviewed Labs: ordered. Radiology: ordered.  Risk Prescription drug management.   This patient presents to the ED for concern of back pain, this involves an extensive number of treatment options, and is a complaint that carries with it a high risk of complications and morbidity.  I considered the following differential and admission for this acute, potentially life  threatening condition.  The differential diagnosis includes musculoskeletal pain, kidney stone, urinary tract infection, less likely fracture  MDM:    This is an 83 year old female who presents with atraumatic left-sided back pain.  She is overall nontoxic and vital signs are reassuring.  She has CVA and left paraspinous muscle tenderness to palpation.  She is afebrile.  Labs obtained and reviewed and largely reassuring.  Urinalysis is consistent with a UTI.  Urine culture was sent and patient was given a dose of Rocephin.  CT stone study was obtained and does not show any corresponding kidney stone.  She may have an early ascending UTI.  Discussed with patient and her family.  Will discharge with antibiotics.  (Labs, imaging, consults)  Labs: I Ordered, and personally interpreted labs.  The pertinent results include: CBC, BMP, urinalysis  Imaging Studies ordered: I ordered imaging studies including CT stone study I independently visualized and interpreted imaging. I agree with the radiologist interpretation  Additional history obtained from chart review.  External  records from outside source obtained and reviewed including prior evaluations  Cardiac Monitoring: The patient was maintained on a cardiac monitor.  If on the cardiac monitor, I personally viewed and interpreted the cardiac monitored which showed an underlying rhythm [id number]  Reevaluation: After the interventions noted above, I reevaluated the patient and found that they have :improved  Social Determinants of Health:  lives independently  Disposition: Discharge  Co morbidities that complicate the patient evaluation  Past Medical History:  Diagnosis Date   Diabetes mellitus    type 2   Dyslipidemia    Glaucoma    Gout    Hypertension    Refractory / Severe     Medicines Meds ordered this encounter  Medications   oxyCODONE-acetaminophen (PERCOCET/ROXICET) 5-325 MG per tablet 1 tablet    Refill:  0   cefTRIAXone (ROCEPHIN) 1 g in sodium chloride 0.9 % 100 mL IVPB    Antibiotic Indication::   UTI    I have reviewed the patients home medicines and have made adjustments as needed  Problem List / ED Course: Problem List Items Addressed This Visit   None Visit Diagnoses       Acute left-sided low back pain without sciatica    -  Primary   Relevant Medications   oxyCODONE-acetaminophen (PERCOCET/ROXICET) 5-325 MG per tablet 1 tablet (Completed)     Urinary tract infection without hematuria, site unspecified       Relevant Medications   cefTRIAXone (ROCEPHIN) 1 g in sodium chloride 0.9 % 100 mL IVPB                   Final Clinical Impression(s) / ED Diagnoses Final diagnoses:  Acute left-sided low back pain without sciatica  Urinary tract infection without hematuria, site unspecified    Rx / DC Orders ED Discharge Orders     None         Shon Baton, MD 03/20/23 2135

## 2023-03-23 LAB — URINE CULTURE: Culture: >=100000 — AB

## 2023-03-24 ENCOUNTER — Telehealth (HOSPITAL_BASED_OUTPATIENT_CLINIC_OR_DEPARTMENT_OTHER): Payer: Self-pay | Admitting: *Deleted

## 2023-03-24 NOTE — Telephone Encounter (Signed)
Post ED Visit - Positive Culture Follow-up  Culture report reviewed by antimicrobial stewardship pharmacist: [facility] []  Enzo Bi, Pharm.D. []  Celedonio Miyamoto, Pharm.D., BCPS AQ-ID []  Garvin Fila, Pharm.D., BCPS []  Georgina Pillion, [address].D., BCPS []  [place], [address].D., BCPS, AAHIVP []  Estella Husk, Pharm.D., BCPS, AAHIVP []  Lysle Pearl, PharmD, BCPS []  Phillips Climes, PharmD, BCPS []  Agapito Games, PharmD, BCPS []  Verlan Friends, PharmD []  Mervyn Gay, PharmD, BCPS [x]  Ivery Quale, PharmD  [facility] []  Len Childs, PharmD []  Greer Pickerel, PharmD []  Adalberto Cole, PharmD []  Perlie Gold, Rph []  Lonell Face) Jean Rosenthal, PharmD []  Earl Many, PharmD []  Junita Push, PharmD []  Dorna Leitz, PharmD []  Terrilee Files, PharmD []  Lynann Beaver, PharmD []  Keturah Barre, PharmD []  Loralee Pacas, PharmD []  Bernadene Person, PharmD   Positive urine culture Treated with Cephalexin, organism sensitive to the same and no further patient follow-up is required at this time.  Crystal Fowler 03/24/2023, 8:52 AM

## 2023-03-29 DIAGNOSIS — M5440 Lumbago with sciatica, unspecified side: Secondary | ICD-10-CM | POA: Diagnosis not present

## 2023-03-29 DIAGNOSIS — M25562 Pain in left knee: Secondary | ICD-10-CM | POA: Diagnosis not present

## 2023-03-29 DIAGNOSIS — N390 Urinary tract infection, site not specified: Secondary | ICD-10-CM | POA: Diagnosis not present

## 2023-04-10 DIAGNOSIS — N390 Urinary tract infection, site not specified: Secondary | ICD-10-CM | POA: Diagnosis not present

## 2023-04-10 DIAGNOSIS — I10 Essential (primary) hypertension: Secondary | ICD-10-CM | POA: Diagnosis not present

## 2023-04-10 DIAGNOSIS — E1129 Type 2 diabetes mellitus with other diabetic kidney complication: Secondary | ICD-10-CM | POA: Diagnosis not present

## 2023-04-10 DIAGNOSIS — E118 Type 2 diabetes mellitus with unspecified complications: Secondary | ICD-10-CM | POA: Diagnosis not present

## 2023-04-10 DIAGNOSIS — E7800 Pure hypercholesterolemia, unspecified: Secondary | ICD-10-CM | POA: Diagnosis not present

## 2023-04-16 DIAGNOSIS — R946 Abnormal results of thyroid function studies: Secondary | ICD-10-CM | POA: Diagnosis not present

## 2023-04-16 DIAGNOSIS — I129 Hypertensive chronic kidney disease with stage 1 through stage 4 chronic kidney disease, or unspecified chronic kidney disease: Secondary | ICD-10-CM | POA: Diagnosis not present

## 2023-04-16 DIAGNOSIS — E7800 Pure hypercholesterolemia, unspecified: Secondary | ICD-10-CM | POA: Diagnosis not present

## 2023-04-16 DIAGNOSIS — E118 Type 2 diabetes mellitus with unspecified complications: Secondary | ICD-10-CM | POA: Diagnosis not present

## 2023-04-16 DIAGNOSIS — M169 Osteoarthritis of hip, unspecified: Secondary | ICD-10-CM | POA: Diagnosis not present

## 2023-04-16 DIAGNOSIS — E1129 Type 2 diabetes mellitus with other diabetic kidney complication: Secondary | ICD-10-CM | POA: Diagnosis not present

## 2023-04-16 DIAGNOSIS — N1831 Chronic kidney disease, stage 3a: Secondary | ICD-10-CM | POA: Diagnosis not present

## 2023-05-14 DIAGNOSIS — Z961 Presence of intraocular lens: Secondary | ICD-10-CM | POA: Diagnosis not present

## 2023-05-14 DIAGNOSIS — H401123 Primary open-angle glaucoma, left eye, severe stage: Secondary | ICD-10-CM | POA: Diagnosis not present

## 2023-05-14 DIAGNOSIS — E119 Type 2 diabetes mellitus without complications: Secondary | ICD-10-CM | POA: Diagnosis not present

## 2023-05-14 DIAGNOSIS — H401112 Primary open-angle glaucoma, right eye, moderate stage: Secondary | ICD-10-CM | POA: Diagnosis not present

## 2023-08-18 DIAGNOSIS — H5203 Hypermetropia, bilateral: Secondary | ICD-10-CM | POA: Diagnosis not present

## 2023-08-18 DIAGNOSIS — H40033 Anatomical narrow angle, bilateral: Secondary | ICD-10-CM | POA: Diagnosis not present

## 2023-09-04 DIAGNOSIS — L259 Unspecified contact dermatitis, unspecified cause: Secondary | ICD-10-CM | POA: Diagnosis not present

## 2023-09-11 DIAGNOSIS — H401123 Primary open-angle glaucoma, left eye, severe stage: Secondary | ICD-10-CM | POA: Diagnosis not present

## 2023-09-11 DIAGNOSIS — H401112 Primary open-angle glaucoma, right eye, moderate stage: Secondary | ICD-10-CM | POA: Diagnosis not present

## 2023-09-11 DIAGNOSIS — Z961 Presence of intraocular lens: Secondary | ICD-10-CM | POA: Diagnosis not present

## 2023-10-08 DIAGNOSIS — N1831 Chronic kidney disease, stage 3a: Secondary | ICD-10-CM | POA: Diagnosis not present

## 2023-10-08 DIAGNOSIS — I129 Hypertensive chronic kidney disease with stage 1 through stage 4 chronic kidney disease, or unspecified chronic kidney disease: Secondary | ICD-10-CM | POA: Diagnosis not present

## 2023-10-08 DIAGNOSIS — E118 Type 2 diabetes mellitus with unspecified complications: Secondary | ICD-10-CM | POA: Diagnosis not present

## 2023-10-08 DIAGNOSIS — E7800 Pure hypercholesterolemia, unspecified: Secondary | ICD-10-CM | POA: Diagnosis not present

## 2023-10-08 DIAGNOSIS — E1129 Type 2 diabetes mellitus with other diabetic kidney complication: Secondary | ICD-10-CM | POA: Diagnosis not present

## 2023-10-15 DIAGNOSIS — M169 Osteoarthritis of hip, unspecified: Secondary | ICD-10-CM | POA: Diagnosis not present

## 2023-10-15 DIAGNOSIS — E118 Type 2 diabetes mellitus with unspecified complications: Secondary | ICD-10-CM | POA: Diagnosis not present

## 2023-10-15 DIAGNOSIS — E1129 Type 2 diabetes mellitus with other diabetic kidney complication: Secondary | ICD-10-CM | POA: Diagnosis not present

## 2023-10-15 DIAGNOSIS — Z0000 Encounter for general adult medical examination without abnormal findings: Secondary | ICD-10-CM | POA: Diagnosis not present

## 2023-10-15 DIAGNOSIS — E7800 Pure hypercholesterolemia, unspecified: Secondary | ICD-10-CM | POA: Diagnosis not present

## 2023-10-15 DIAGNOSIS — I10 Essential (primary) hypertension: Secondary | ICD-10-CM | POA: Diagnosis not present

## 2023-10-15 DIAGNOSIS — R413 Other amnesia: Secondary | ICD-10-CM | POA: Diagnosis not present

## 2023-11-23 ENCOUNTER — Other Ambulatory Visit: Payer: Self-pay | Admitting: Family Medicine

## 2023-11-23 DIAGNOSIS — Z1231 Encounter for screening mammogram for malignant neoplasm of breast: Secondary | ICD-10-CM

## 2023-12-31 ENCOUNTER — Ambulatory Visit
Admission: RE | Admit: 2023-12-31 | Discharge: 2023-12-31 | Disposition: A | Source: Ambulatory Visit | Attending: Family Medicine | Admitting: Family Medicine

## 2023-12-31 DIAGNOSIS — Z1231 Encounter for screening mammogram for malignant neoplasm of breast: Secondary | ICD-10-CM
